# Patient Record
Sex: Male | Born: 1970
Health system: Southern US, Community
[De-identification: ages and names within clinical notes are randomized; demographics above are authoritative.]

## PROBLEM LIST (undated history)

## (undated) DIAGNOSIS — I10 Essential (primary) hypertension: Secondary | ICD-10-CM

## (undated) DIAGNOSIS — K219 Gastro-esophageal reflux disease without esophagitis: Secondary | ICD-10-CM

## (undated) DIAGNOSIS — M109 Gout, unspecified: Secondary | ICD-10-CM

## (undated) DIAGNOSIS — Z87442 Personal history of urinary calculi: Secondary | ICD-10-CM

## (undated) DIAGNOSIS — E785 Hyperlipidemia, unspecified: Secondary | ICD-10-CM

## (undated) DIAGNOSIS — R12 Heartburn: Secondary | ICD-10-CM

## (undated) HISTORY — DX: Heartburn: R12

## (undated) HISTORY — DX: Personal history of urinary calculi: Z87.442

## (undated) HISTORY — DX: Hyperlipidemia, unspecified: E78.5

## (undated) HISTORY — DX: Essential (primary) hypertension: I10

## (undated) HISTORY — PX: CYSTECTOMY: SUR359

---
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
## 2013-09-08 ENCOUNTER — Ambulatory Visit (INDEPENDENT_AMBULATORY_CARE_PROVIDER_SITE_OTHER): Payer: PRIVATE HEALTH INSURANCE | Admitting: Family Medicine

## 2013-09-08 ENCOUNTER — Encounter: Payer: Self-pay | Admitting: *Deleted

## 2013-09-08 ENCOUNTER — Encounter: Payer: Self-pay | Admitting: Family Medicine

## 2013-09-08 VITALS — BP 150/96 | HR 80 | Temp 98.2°F | Ht 68.0 in | Wt 198.0 lb

## 2013-09-08 DIAGNOSIS — R12 Heartburn: Secondary | ICD-10-CM

## 2013-09-08 DIAGNOSIS — E785 Hyperlipidemia, unspecified: Secondary | ICD-10-CM | POA: Insufficient documentation

## 2013-09-08 DIAGNOSIS — Z87442 Personal history of urinary calculi: Secondary | ICD-10-CM | POA: Insufficient documentation

## 2013-09-08 DIAGNOSIS — Z23 Encounter for immunization: Secondary | ICD-10-CM

## 2013-09-08 DIAGNOSIS — I10 Essential (primary) hypertension: Secondary | ICD-10-CM | POA: Insufficient documentation

## 2013-09-08 MED ORDER — AMLODIPINE BESYLATE 5 MG PO TABS: 5.0000 mg | ORAL_TABLET | Freq: Every day | ORAL | Status: DC

## 2013-09-08 NOTE — Patient Instructions (Addendum)
Flu shot today. Return at your convenience fasting for blood work. Let's start amlodipine 5mg  daily for blood pressure - watch for ankle swelling. Baseline EKG today. Return in 1-2 months for follow up. Update me sooner if any concerns.

## 2013-09-08 NOTE — Assessment & Plan Note (Addendum)
Several elevated readings in the past as well as on recheck today.  Will start amlodipine 5mg  daily - discussed common side effect to monitor including ankle swelling. baseline EKG today and return fasting for blood work. RTC 1-2 mo for f/u Pt agrees with plan.  EKG NSR rate 75, normal axis and intervals, no acute ST/T changes.

## 2013-09-08 NOTE — Progress Notes (Signed)
Pre-visit discussion using our clinic review tool. No additional management support is needed unless otherwise documented below in the visit note.  

## 2013-09-08 NOTE — Progress Notes (Signed)
Subjective:    Patient ID: Shawn Price, male    DOB: 02/04/1969, 42 y.o.   MRN: [id number]  HPI CC: new pt to establish  Prior saw Dr. Dossie Arbour then Dr. Patrecia Pace.  Hasn't had good exp with MDs in past. Planning trip to [facility]  Elevated BP - h/o this.  Has had high blood pressure readings for years.  Never on meds.  No HA, vision changes, CP/tightness, SOB, leg swelling. Feels flushed when bp increases.  Would be interested in med for this.  Elevated cholesterol levels as well - prior treated with crestor but caused significant myalgias/weakness.  This was changed to niacin but caused significant flushing.  Heartburn - controlled with tums.  Noticing increasing discomfort with bilateral shoulders - backed off lifting and going to gym.  Wonders about RTC issue.  Stopped running because knees started bothering him.  Sees eye doctor regularly (wears contacts). Sees dentist every 3 yrs (Dr. Wilson Singer).  Remarried this year (2014) Lives with 2nd wife, 2 daughters and step son, 1 dog Occupation: Emergency planning/management officer Edu: bachelor degree Activity: runs a few times a week Diet: good water, some fruits/vegetables   Preventative No recent CPE Flu shot today.  Medications and allergies reviewed and updated in chart.  Past histories reviewed and updated if relevant as below. There are no active problems to display for this patient.  Past Medical History  Diagnosis Date  . Heartburn     controlled with tums  . HLD (hyperlipidemia)   . HTN (hypertension)   . History of kidney stones 1990   Past Surgical History  Procedure Laterality Date  . Cystectomy Right ~1998    ganglion cyst on wrist   History  Substance Use Topics  . Smoking status: Never Smoker   . Smokeless tobacco: Current User    Types: Snuff     Comment: 1 can/day  . Alcohol Use: Yes     Comment: Regular-beer at night when at home   Family History  Problem Relation Age of Onset  . Hypertension Mother     father, multiple  relatives  . Stroke Paternal Grandmother 31  . Hyperlipidemia Mother     father, multiple relatives  . Cancer Maternal Grandfather 70    lung, smoker  . COPD Maternal Grandmother   . Heart failure Maternal Grandmother 90  . Diabetes Neg Hx    Allergies  Allergen Reactions  . Crestor [Rosuvastatin] Other (See Comments)    myalgias   No current outpatient prescriptions on file prior to visit.   No current facility-administered medications on file prior to visit.     Review of Systems Per HPI    Objective:   Physical Exam  Nursing note and vitals reviewed. Constitutional: He is oriented to person, place, and time. He appears well-developed and well-nourished. No distress.  HENT:  Head: Normocephalic and atraumatic.  Right Ear: Hearing, tympanic membrane, external ear and ear canal normal.  Left Ear: Hearing, tympanic membrane, external ear and ear canal normal.  Nose: Nose normal.  Mouth/Throat: Oropharynx is clear and moist. No oropharyngeal exudate.  Eyes: Conjunctivae and EOM are normal. Pupils are equal, round, and reactive to light. No scleral icterus.  Neck: Normal range of motion. Neck supple. No thyromegaly present.  Cardiovascular: Normal rate, regular rhythm, normal heart sounds and intact distal pulses.   No murmur heard. Pulses:      Radial pulses are 2+ on the right side, and 2+ on the left side.  Pulmonary/Chest: Effort  normal and breath sounds normal. No respiratory distress. He has no wheezes. He has no rales.  Abdominal: Soft. Bowel sounds are normal. He exhibits no distension and no mass. There is no tenderness. There is no rebound and no guarding.  Musculoskeletal: Normal range of motion. He exhibits no edema.  Lymphadenopathy:    He has no cervical adenopathy.  Neurological: He is alert and oriented to person, place, and time.  CN grossly intact, station and gait intact  Skin: Skin is warm and dry. No rash noted.  Psychiatric: He has a normal mood and  affect. His behavior is normal. Judgment and thought content normal.       Assessment & Plan:

## 2013-09-08 NOTE — Assessment & Plan Note (Addendum)
H/o this.  Check FLP when he returns fasting. H/o severe myalgias with crestor and flushing with niacin.

## 2013-09-08 NOTE — Addendum Note (Signed)
Addended by: Josph Macho A on: 09/08/2013 10:43 AM   Modules accepted: Orders

## 2013-09-08 NOTE — Addendum Note (Signed)
Addended by: Josph Macho A on: 09/08/2013 10:27 AM   Modules accepted: Orders

## 2013-10-20 ENCOUNTER — Ambulatory Visit: Payer: Self-pay | Admitting: Family Medicine

## 2013-12-09 ENCOUNTER — Other Ambulatory Visit (INDEPENDENT_AMBULATORY_CARE_PROVIDER_SITE_OTHER): Payer: PRIVATE HEALTH INSURANCE

## 2013-12-09 ENCOUNTER — Encounter: Payer: Self-pay | Admitting: Radiology

## 2013-12-09 ENCOUNTER — Encounter: Payer: Self-pay | Admitting: Family Medicine

## 2013-12-09 DIAGNOSIS — I10 Essential (primary) hypertension: Secondary | ICD-10-CM

## 2013-12-09 DIAGNOSIS — E785 Hyperlipidemia, unspecified: Secondary | ICD-10-CM

## 2013-12-09 LAB — COMPREHENSIVE METABOLIC PANEL
ALT: 40 U/L (ref 0–53)
AST: 21 U/L (ref 0–37)
Albumin: 4.7 g/dL (ref 3.5–5.2)
Alkaline Phosphatase: 62 U/L (ref 39–117)
BUN: 16 mg/dL (ref 6–23)
CO2: 27 mEq/L (ref 19–32)
Calcium: 9.9 mg/dL (ref 8.4–10.5)
Chloride: 102 mEq/L (ref 96–112)
Creatinine, Ser: 1.1 mg/dL (ref 0.4–1.5)
GFR: 79.75 mL/min (ref >60.00)
Glucose, Bld: 105 mg/dL — ABNORMAL HIGH (ref 70–99)
Potassium: 4.4 mEq/L (ref 3.5–5.1)
Sodium: 137 mEq/L (ref 135–145)
Total Bilirubin: 0.7 mg/dL (ref 0.3–1.2)
Total Protein: 7.7 g/dL (ref 6.0–8.3)

## 2013-12-09 LAB — LIPID PANEL
Cholesterol: 263 mg/dL — ABNORMAL HIGH (ref 0–200)
HDL: 33.2 mg/dL — ABNORMAL LOW (ref >39.00)
LDL Cholesterol: 112 mg/dL — ABNORMAL HIGH (ref 0–99)
Total CHOL/HDL Ratio: 8
Triglycerides: 587 mg/dL — ABNORMAL HIGH (ref 0.0–149.0)
VLDL: 117.4 mg/dL — ABNORMAL HIGH (ref 0.0–40.0)

## 2013-12-09 LAB — TSH: TSH: 1.56 u[IU]/mL (ref 0.35–5.50)

## 2013-12-10 ENCOUNTER — Other Ambulatory Visit: Payer: Self-pay | Admitting: Family Medicine

## 2013-12-10 MED ORDER — FENOFIBRATE 145 MG PO TABS: 145.0000 mg | ORAL_TABLET | Freq: Every day | ORAL | Status: DC

## 2013-12-14 NOTE — Progress Notes (Signed)
Pt did not pick up fenofibrate due to cost of med (cost to pt was $50.00). Pt wants to know if there is a coupon for med or can a less expensive med be sent  [facility]. Pt request cb at [phone].

## 2013-12-15 MED ORDER — FENOFIBRATE MICRONIZED 134 MG PO CAPS: 134.0000 mg | ORAL_CAPSULE | Freq: Every day | ORAL | Status: DC

## 2013-12-15 NOTE — Addendum Note (Signed)
Addended by: Eustaquio BoydenGUTIERREZ, Izmael Duross on: 12/15/2013 08:37 AM   Modules accepted: Orders, Medications

## 2013-12-15 NOTE — Progress Notes (Signed)
Pt left v/m requesting cb at [phone].

## 2013-12-15 NOTE — Progress Notes (Signed)
Left message on voice mail  to call back

## 2013-12-15 NOTE — Progress Notes (Signed)
plz notify patient I've sent in lower dose hopeful for cheaper.  To call pharmacy to check on price and if affordable try this med.  O/w let me know

## 2013-12-17 NOTE — Progress Notes (Signed)
Pt advise new Rx sent in (lower dose) and to let us know if this Rx is to expensive

## 2014-01-14 ENCOUNTER — Other Ambulatory Visit: Payer: Self-pay | Admitting: Family Medicine

## 2014-01-15 ENCOUNTER — Other Ambulatory Visit: Payer: Self-pay | Admitting: Family Medicine

## 2014-03-13 ENCOUNTER — Other Ambulatory Visit: Payer: Self-pay | Admitting: Family Medicine

## 2014-03-21 ENCOUNTER — Other Ambulatory Visit: Payer: Self-pay | Admitting: Family Medicine

## 2014-07-31 ENCOUNTER — Other Ambulatory Visit: Payer: Self-pay | Admitting: Family Medicine

## 2014-09-03 ENCOUNTER — Other Ambulatory Visit: Payer: Self-pay | Admitting: Family Medicine

## 2014-12-18 ENCOUNTER — Other Ambulatory Visit: Payer: Self-pay | Admitting: Family Medicine

## 2015-03-04 ENCOUNTER — Other Ambulatory Visit: Payer: Self-pay | Admitting: Family Medicine

## 2015-03-04 ENCOUNTER — Other Ambulatory Visit (INDEPENDENT_AMBULATORY_CARE_PROVIDER_SITE_OTHER): Payer: No Typology Code available for payment source

## 2015-03-04 DIAGNOSIS — E785 Hyperlipidemia, unspecified: Secondary | ICD-10-CM

## 2015-03-04 DIAGNOSIS — I10 Essential (primary) hypertension: Secondary | ICD-10-CM

## 2015-03-04 LAB — LDL CHOLESTEROL, DIRECT: Direct LDL: 118 mg/dL

## 2015-03-04 LAB — LIPID PANEL
Cholesterol: 236 mg/dL — ABNORMAL HIGH (ref 0–200)
HDL: 31.4 mg/dL — ABNORMAL LOW (ref >39.00)
Total CHOL/HDL Ratio: 8
Triglycerides: 537 mg/dL — ABNORMAL HIGH (ref 0.0–149.0)

## 2015-03-04 LAB — COMPREHENSIVE METABOLIC PANEL
ALT: 24 U/L (ref 0–53)
AST: 14 U/L (ref 0–37)
Albumin: 4.6 g/dL (ref 3.5–5.2)
Alkaline Phosphatase: 65 U/L (ref 39–117)
BUN: 15 mg/dL (ref 6–23)
CO2: 28 mEq/L (ref 19–32)
Calcium: 9.8 mg/dL (ref 8.4–10.5)
Chloride: 102 mEq/L (ref 96–112)
Creatinine, Ser: 1.02 mg/dL (ref 0.40–1.50)
GFR: 84.69 mL/min (ref >60.00)
Glucose, Bld: 108 mg/dL — ABNORMAL HIGH (ref 70–99)
Potassium: 4.6 mEq/L (ref 3.5–5.1)
Sodium: 137 mEq/L (ref 135–145)
Total Bilirubin: 0.5 mg/dL (ref 0.2–1.2)
Total Protein: 7.4 g/dL (ref 6.0–8.3)

## 2015-03-10 ENCOUNTER — Encounter: Payer: Self-pay | Admitting: Family Medicine

## 2015-03-10 ENCOUNTER — Ambulatory Visit (INDEPENDENT_AMBULATORY_CARE_PROVIDER_SITE_OTHER): Payer: No Typology Code available for payment source | Admitting: Family Medicine

## 2015-03-10 ENCOUNTER — Encounter (INDEPENDENT_AMBULATORY_CARE_PROVIDER_SITE_OTHER): Payer: Self-pay

## 2015-03-10 VITALS — BP 130/88 | HR 97 | Temp 98.5°F | Ht 68.0 in | Wt 198.8 lb

## 2015-03-10 DIAGNOSIS — E785 Hyperlipidemia, unspecified: Secondary | ICD-10-CM | POA: Diagnosis not present

## 2015-03-10 DIAGNOSIS — Z0000 Encounter for general adult medical examination without abnormal findings: Secondary | ICD-10-CM | POA: Diagnosis not present

## 2015-03-10 DIAGNOSIS — R7303 Prediabetes: Secondary | ICD-10-CM | POA: Insufficient documentation

## 2015-03-10 DIAGNOSIS — E119 Type 2 diabetes mellitus without complications: Secondary | ICD-10-CM | POA: Insufficient documentation

## 2015-03-10 DIAGNOSIS — I10 Essential (primary) hypertension: Secondary | ICD-10-CM

## 2015-03-10 DIAGNOSIS — L819 Disorder of pigmentation, unspecified: Secondary | ICD-10-CM | POA: Insufficient documentation

## 2015-03-10 DIAGNOSIS — M25519 Pain in unspecified shoulder: Secondary | ICD-10-CM | POA: Diagnosis not present

## 2015-03-10 DIAGNOSIS — B360 Pityriasis versicolor: Secondary | ICD-10-CM

## 2015-03-10 DIAGNOSIS — R739 Hyperglycemia, unspecified: Secondary | ICD-10-CM

## 2015-03-10 DIAGNOSIS — E1169 Type 2 diabetes mellitus with other specified complication: Secondary | ICD-10-CM | POA: Insufficient documentation

## 2015-03-10 MED ORDER — AMLODIPINE BESYLATE 2.5 MG PO TABS: 2.5000 mg | ORAL_TABLET | Freq: Every day | ORAL | Status: DC

## 2015-03-10 MED ORDER — FENOFIBRATE MICRONIZED 134 MG PO CAPS: 134.0000 mg | ORAL_CAPSULE | Freq: Every day | ORAL | Status: DC

## 2015-03-10 NOTE — Progress Notes (Signed)
Pre visit review using our clinic review tool, if applicable. No additional management support is needed unless otherwise documented below in the visit note. 
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
## 2015-03-10 NOTE — Patient Instructions (Addendum)
Let's try lower blood pressure medicine dose (2.5mg  amlodipine) and monitor at home over next month (once a week). Let me know if consistently >140/90. Restart cholesterol medicine For tinea versicolor - try nizoral or selsun blue shampoo on skin once weekly. For shoulders - try exercises provided today. Let us know if worsening Return in 3-4 months for recheck cholesterol and sugar. Return in 1 year for next physical.

## 2015-03-10 NOTE — Assessment & Plan Note (Signed)
Preventative protocols reviewed and updated unless pt declined. Discussed healthy diet and lifestyle.  

## 2015-03-10 NOTE — Assessment & Plan Note (Signed)
Pt declines oral antifungal currently. Mainly cosmetic concern. Discussed nizoral or selsun blue shampoo on skin weekly for control. Consider oral antifungal if ineffective treatment.

## 2015-03-10 NOTE — Assessment & Plan Note (Signed)
Chronic, stable off meds for now but notices some elevated bp sxs. Will restart at lower dose and pt will monitor and notify me if persistently elevated.

## 2015-03-10 NOTE — Assessment & Plan Note (Signed)
Detailed discussion of diet changes to improve glycemic control.

## 2015-03-10 NOTE — Assessment & Plan Note (Signed)
Exam more consistent with supraspinatus tendinopathy or partial tear. Provided with exercises from [facility] pt advisor on RTC injury. Provided with resistance band.

## 2015-03-10 NOTE — Assessment & Plan Note (Signed)
Marked hypertriglyceridemia along with low HDL. Restart lofibra  daily.  Recheck FLP in 3-4 mo. Pt agrees with plan.

## 2015-03-27 ENCOUNTER — Encounter: Payer: Self-pay | Admitting: Physician Assistant

## 2015-03-27 ENCOUNTER — Ambulatory Visit: Payer: BLUE CROSS/BLUE SHIELD

## 2015-03-27 ENCOUNTER — Ambulatory Visit
Admission: EM | Admit: 2015-03-27 | Discharge: 2015-03-27 | Disposition: A | Discharge status: Home / Self Care | Payer: BLUE CROSS/BLUE SHIELD | Attending: Internal Medicine | Admitting: Internal Medicine

## 2015-03-27 DIAGNOSIS — I10 Essential (primary) hypertension: Secondary | ICD-10-CM | POA: Insufficient documentation

## 2015-03-27 DIAGNOSIS — E785 Hyperlipidemia, unspecified: Secondary | ICD-10-CM | POA: Diagnosis not present

## 2015-03-27 DIAGNOSIS — M10072 Idiopathic gout, left ankle and foot: Secondary | ICD-10-CM

## 2015-03-27 DIAGNOSIS — M79672 Pain in left foot: Secondary | ICD-10-CM | POA: Diagnosis present

## 2015-03-27 MED ORDER — KETOROLAC TROMETHAMINE 60 MG/2ML IM SOLN
60.0000 mg | Freq: Once | INTRAMUSCULAR | Status: AC
  Administered 2015-03-27: 60 mg via INTRAMUSCULAR

## 2015-03-27 MED ORDER — COLCHICINE 0.6 MG PO TABS: ORAL_TABLET | ORAL | Status: DC
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
## 2015-03-27 NOTE — Discharge Instructions (Signed)

## 2015-05-15 ENCOUNTER — Ambulatory Visit
Admission: EM | Admit: 2015-05-15 | Discharge: 2015-05-15 | Disposition: A | Discharge status: Home / Self Care | Payer: BLUE CROSS/BLUE SHIELD | Attending: Internal Medicine | Admitting: Internal Medicine

## 2015-05-15 DIAGNOSIS — M10472 Other secondary gout, left ankle and foot: Secondary | ICD-10-CM | POA: Diagnosis not present

## 2015-05-15 DIAGNOSIS — L02811 Cutaneous abscess of head [any part, except face]: Secondary | ICD-10-CM

## 2015-05-15 HISTORY — DX: Gout, unspecified: M10.9

## 2015-05-15 MED ORDER — SULFAMETHOXAZOLE-TRIMETHOPRIM 800-160 MG PO TABS: 1.0000 | ORAL_TABLET | Freq: Two times a day (BID) | ORAL | Status: AC

## 2015-05-15 MED ORDER — COLCHICINE 0.6 MG PO TABS: 0.6000 mg | ORAL_TABLET | Freq: Every day | ORAL | Status: DC

## 2015-05-15 NOTE — ED Notes (Addendum)
Hx first gout attack 03/27/15. C/o pain outer left foot "aching" started Thursday. This "feels the same". Also due to having short haircuts, has abscess posterior right scalp

## 2015-05-20 NOTE — ED Provider Notes (Signed)
CSN: [id number]     Arrival date & time 05/15/15  1123 History   First MD Initiated Contact with Patient 05/15/15 1331     Chief Complaint  Patient presents with  . 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 Had recent episode as well.  Discussed diet and particularly hydration in these hot summer month. Also with heat and sweating as well as short hair cut, He is having difficulty with abscesses of his scalp. Wears a hat or helmet almost always. Notes single larger raised tender area and multiple smaller concerns Denies hx of same. Has tried Selsun shampoo without effect Past Medical History  Diagnosis Date  . Heartburn     controlled with tums  . Dyslipidemia     triglycerides  . HTN (hypertension)   . History of kidney stones 1990  . Gout    Past Surgical History  Procedure Laterality Date  . Cystectomy Right ~1998    ganglion cyst on wrist   Family History  Problem Relation Age of Onset  . Hypertension Mother     father, multiple relatives  . Hyperlipidemia Mother     father, multiple relatives  . Stroke Paternal Grandmother 27  . Cancer Maternal Grandfather 70    lung, smoker  . COPD Maternal Grandmother   . Heart failure Maternal Grandmother 90  . Diabetes Neg Hx    Social History  Substance Use Topics  . Smoking status: Never Smoker   . Smokeless tobacco: Current User    Types: Snuff     Comment: 1 can/day  . Alcohol Use: Yes     Comment: Regular-beer at night when at home    Review of Systems Review of 10 systems negative for acute change except as referenced in HPI  Allergies  Crestor  Home Medications   Prior to Admission medications   Medication Sig Start Date End Date Taking? Authorizing Provider  amLODipine (NORVASC) 2.5 MG tablet Take 1 tablet (2.5 mg total) by mouth daily. 03/10/15  Yes Eustaquio Boyden, MD  fenofibrate micronized (LOFIBRA) 134 MG capsule Take 1 capsule (134 mg total) by mouth daily before breakfast. 03/10/15  Yes Eustaquio Boyden, MD  colchicine 0.6 MG tablet Take 1 tablet (0.6 mg total) by mouth daily. 2 tablets now-- repeat 1 in one hour; then 1-2 per day as needed for next 10 days 05/15/15   Rae Halsted, PA-C  sulfamethoxazole-trimethoprim (BACTRIM DS,SEPTRA DS) 800-160 MG per tablet Take 1 tablet by mouth 2 (two) times daily. 05/15/15 05/22/15  Rae Halsted, PA-C   Meds Ordered and Administered this Visit  Medications - No data to display  BP 136/96 mmHg  Pulse 88  Temp(Src) 98 F (36.7 C) (Oral)  Resp 16  Ht 5\' 8"  (1.727 m)  Wt 200 lb (90.719 kg)  BMI 30.42 kg/m2  SpO2 100% No data found.   Physical Exam    Constitutional -alert and oriented,well appearing,mild distress- left foot aches Head-atraumatic, normocephalic. Wearing baseball cap-  scalp with multiple raise abscesses under distribution of cap coverage. Hair is very short and areas of alopecia have developed as well Eyes- conjunctiva normal, EOMI ,conjugate gaze Nose- no congestion or rhinorrhea Mouth/throat- mucous membranes moist , Neck- supple without lymphadenopathy CV- regular rate, grossly normal heart sounds,  Resp-no distress, normal respiratory effort,clear to auscultation bilaterally GI- o distention GU- not examined MSK- left foot mid-foot lateral mildly tender  to touch and exam, very faintly warm- patient recognizes pattern similar to last  Gout. Denies trauma to area. No ecchymosis Remainder of extremities non tender, normal ROM, all extremities, ambulatory, self-care Neuro- normal speech and language, no gross focal neurological deficit appreciated, no gait instability, Skin-warm,dry ,intact; no rash noted Psych-mood and affect grossly normal; speech and behavior grossly normal  ED Course  Procedures (including critical care time)  Labs Review Labs Reviewed - No data to  display  Imaging Review No results found.    DIscussed concerns about increasing., perhaps permanent alopecia as side effect of infectious process scalp. Make a priority of Derm consultation- business card given for [facility] as I had it available - but Derm of choice indicated. Antibiotic Rx initiated. Shampoo daily-gentle scalp- no hair products Initiate colchicine  in hopes of averting/diminishing Gout attack. 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 Has been particularly active in community lately with the national issues of police stress.     MDM   1. Other secondary acute gout of left foot   2. Abscess, scalp   Diagnosis and treatment discussed. Initiate antibiotic coverage for scalp and referral to Dermatology ASAP  . Questions fielded, expectations and recommendations reviewed.  Patient expresses understanding. Will return to [facility] with questions, concern or exacerbation.    Discharge Medication List as of 05/15/2015  1:57 PM    START taking these medications   Details  sulfamethoxazole-trimethoprim (BACTRIM DS,SEPTRA DS) 800-160 MG per tablet Take 1 tablet by mouth 2 (two) times daily., Starting 05/15/2015, Until Sun 05/22/15, Normal      colchicine 0.6 mg 2 stat, 1 in one hour then 1-2 daily for 10 days  Rae Halsted, PA-C 05/20/15 4540

## 2015-09-12 ENCOUNTER — Ambulatory Visit
Admission: EM | Admit: 2015-09-12 | Discharge: 2015-09-12 | Disposition: A | Discharge status: Home / Self Care | Payer: BLUE CROSS/BLUE SHIELD | Attending: Family Medicine | Admitting: Family Medicine

## 2015-09-12 DIAGNOSIS — M1A4720 Other secondary chronic gout, left ankle and foot, without tophus (tophi): Secondary | ICD-10-CM

## 2015-09-12 DIAGNOSIS — M109 Gout, unspecified: Secondary | ICD-10-CM

## 2015-09-12 MED ORDER — NAPROXEN 500 MG PO TABS: 500.0000 mg | ORAL_TABLET | Freq: Two times a day (BID) | ORAL | Status: DC

## 2015-09-12 MED ORDER — COLCHICINE 0.6 MG PO TABS: ORAL_TABLET | ORAL | Status: DC

## 2015-09-12 MED ORDER — KETOROLAC TROMETHAMINE 60 MG/2ML IM SOLN
60.0000 mg | Freq: Once | INTRAMUSCULAR | Status: AC
  Administered 2015-09-12: 60 mg via INTRAMUSCULAR

## 2015-09-12 NOTE — ED Provider Notes (Signed)
CSN: [id number]     Arrival date & time 09/12/15  4098 History   First MD Initiated Contact with Patient 09/12/15 [phone]     Chief Complaint  Patient presents with  . 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 It usually occurs on the as of the fifth metatarsal. He states that this started about 1 AM on New Year's Eve night is progressively worsened. Last night he was miserable at work.. Now is having difficulty walking and has a red swollen warm joint at the base of the fifth metatarsal. He used colchicine at this last attack which worked well and would like to resume that. He was going to see his primary but they are closed today due to new years.  Past Medical History  Diagnosis Date  . Heartburn     controlled with tums  . Dyslipidemia     triglycerides  . HTN (hypertension)   . History of kidney stones 1990  . Gout    Past Surgical History  Procedure Laterality Date  . Cystectomy Right ~1998    ganglion cyst on wrist   Family History  Problem Relation Age of Onset  . Hypertension Mother     father, multiple relatives  . Hyperlipidemia Mother     father, multiple relatives  . Stroke Paternal Grandmother 62  . Cancer Maternal Grandfather 70    lung, smoker  . COPD Maternal Grandmother   . Heart failure Maternal Grandmother 90  . Diabetes Neg Hx    Social History  Substance Use Topics  . Smoking status: Never Smoker   . Smokeless tobacco: Current User    Types: Snuff     Comment: 1 can/day  . Alcohol Use: 0.0 oz/week    0 Standard drinks or equivalent per week     Comment: Regular-beer at night when at home    Review of Systems  Constitutional: Positive for activity change.  Musculoskeletal: Positive for joint swelling and arthralgias.  Skin: Positive for color change.  All other systems reviewed and are  negative.   Allergies  Crestor  Home Medications   Prior to Admission medications   Medication Sig Start Date End Date Taking? Authorizing Provider  amLODipine (NORVASC) 2.5 MG tablet Take 1 tablet (2.5 mg total) by mouth daily. 03/10/15  Yes Eustaquio Boyden, MD  fenofibrate micronized (LOFIBRA) 134 MG capsule Take 1 capsule (134 mg total) by mouth daily before breakfast. 03/10/15  Yes Eustaquio Boyden, MD  colchicine (COLCRYS) 0.6 MG tablet Take 2 tabs (1.2 MG ) now . Repeat with 0.6 mg 1 hour later. May take 0.6 mg in 72 hours if necessary 09/12/15   Lutricia Feil, PA-C  naproxen (NAPROSYN) 500 MG tablet Take 1 tablet (500 mg total) by mouth 2 (two) times daily with a meal. 09/12/15   Lutricia Feil, PA-C   Meds Ordered and Administered this Visit   Medications  ketorolac (TORADOL) injection 60 mg (60 mg Intramuscular Given 09/12/15 0916)    BP 134/87 mmHg  Pulse 88  Temp(Src) 97 F (36.1 C) (Tympanic)  Resp 16  Ht 5\' 8"  (1.727 m)  Wt 200 lb (90.719 kg)  BMI 30.42 kg/m2  SpO2 100% No data found.   Physical Exam  Constitutional: He is oriented to person, place, and time. He appears well-developed and well-nourished. No distress.  HENT:  Head: Normocephalic and atraumatic.  Eyes: Conjunctivae are normal. Pupils are equal, round, and reactive to light.  Neck: Normal range of motion. Neck supple.  Musculoskeletal: Normal range of motion. He exhibits edema and tenderness.  Examination of his gait shows a definite antalgic gait on the left. As of the fifth metatarsal shows no swelling or warmth and erythema. He is very tender to the touch even attempting to touch each has a tendency to withdraw the foot. Crepitus or induration is noticed.  Neurological: He is alert and oriented to person, place, and time.  Skin: Skin is warm and dry. He is not diaphoretic.  Psychiatric: He has a normal mood and affect. His behavior is normal. Judgment and thought content normal.  Nursing note  and vitals reviewed.   ED Course  Procedures (including critical care time)  Labs Review Labs Reviewed - No data to display  Imaging Review No results found.   Visual Acuity Review  Right Eye Distance:   Left Eye Distance:   Bilateral Distance:    Right Eye Near:   Left Eye Near:    Bilateral Near:     09:16 Medication Given HM  ketorolac (TORADOL) injection 60 mg - Dose: 60 mg ; Route: Intramuscular ; Site: Right Ventrogluteal ; Scheduled Time: 0915         MDM   1. Other secondary chronic gout of left foot without tophus    Discharge Medication List as of 09/12/2015  9:16 AM    Plan: 1. Test/x-ray results and diagnosis reviewed with patient 2. rx as per orders; risks, benefits, potential side effects reviewed with patient 3. Recommend supportive treatment with dietary changes as necessary formation on low purine. Diet was provided to the patient. Recommended that he follow-up with his primary care physician for consideration of placement on allopurinol to prevent future attacks. Also provide him a prescription of Naprosyn that he may try when he first begins to feel the attack coming on to try and quell it prior to have become exacerbated.  4. F/u prn if symptoms worsen or don't improve     Lutricia FeilWilliam P Kallista Pae, PA-C 09/12/15 [id number]  Lutricia FeilWilliam P Millena Callins, PA-C 09/12/15 1040

## 2015-09-12 NOTE — ED Notes (Signed)
Patient states that this is his third attack of gout in the last 6 months. He states that pain started Sunday morning around 0100. Pain occurs in outer edge of left foot and states that pain is almost unbearable now.

## 2015-09-12 NOTE — Discharge Instructions (Signed)

## 2015-10-21 ENCOUNTER — Encounter: Payer: Self-pay | Admitting: Family Medicine

## 2015-10-21 ENCOUNTER — Ambulatory Visit (INDEPENDENT_AMBULATORY_CARE_PROVIDER_SITE_OTHER): Payer: BLUE CROSS/BLUE SHIELD | Admitting: Family Medicine

## 2015-10-21 VITALS — BP 110/70 | HR 80 | Temp 98.1°F | Ht 68.0 in | Wt 210.5 lb

## 2015-10-21 DIAGNOSIS — M10072 Idiopathic gout, left ankle and foot: Secondary | ICD-10-CM | POA: Diagnosis not present

## 2015-10-21 DIAGNOSIS — Z23 Encounter for immunization: Secondary | ICD-10-CM

## 2015-10-21 DIAGNOSIS — M1A9XX0 Chronic gout, unspecified, without tophus (tophi): Secondary | ICD-10-CM | POA: Insufficient documentation

## 2015-10-21 LAB — URIC ACID: Uric Acid, Serum: 6.8 mg/dL (ref 4.0–7.8)

## 2015-10-21 MED ORDER — ALLOPURINOL 100 MG PO TABS: 100.0000 mg | ORAL_TABLET | Freq: Every day | ORAL | Status: DC

## 2015-10-21 MED ORDER — COLCHICINE 0.6 MG PO TABS: ORAL_TABLET | ORAL | Status: DC

## 2015-10-21 NOTE — Progress Notes (Signed)
Pre visit review using our clinic review tool, if applicable. No additional management support is needed unless otherwise documented below in the visit note. 

## 2015-10-21 NOTE — Assessment & Plan Note (Signed)
No known triggers specifically, but many lifestyle cahnge to reduce uric acid in process.  Will check uric acid, plan starting allopurinol once this flare complete.  Continue colcrys daily until flare resolved.   Follow up in 4-6 weeks for re-eval uric acid.

## 2015-10-21 NOTE — Progress Notes (Signed)
   Subjective:    Patient ID: Shawn Price, male    DOB: October 17, 1970, 45 y.o.   MRN: [id number]  HPI  45 year old male with history of HTN and gout presents with possible gout flare. Severe pain in left foot laterally, gradually worse in last 2 days redness, warmth.  No known injury.  yesterday took 2 tabs of colcrys, then second dose... Today pain is much better.   He was dx with gout emperically last year in 03/2015 with Urgent visit.  X-ray negative.  Does not appear uric acid was checked.  Started on colcrys, ice and elevation.  Since then he has had 3 flares since. 05/2015, 09/2015  He has decrease ETOH intake, has tried to decreased shelfish, working on weight loss.   Family history of gout.  Review of Systems  Constitutional: Negative for fatigue.  HENT: Negative for ear pain.   Eyes: Negative for pain.  Respiratory: Negative for cough.   Cardiovascular: Negative for chest pain.       Objective:   Physical Exam  Constitutional: Vital signs are normal. He appears well-developed and well-nourished.  HENT:  Head: Normocephalic.  Right Ear: Hearing normal.  Left Ear: Hearing normal.  Nose: Nose normal.  Mouth/Throat: Oropharynx is clear and moist and mucous membranes are normal.  Neck: Trachea normal. Carotid bruit is not present. No thyroid mass and no thyromegaly present.  Cardiovascular: Normal rate, regular rhythm and normal pulses.  Exam reveals no gallop, no distant heart sounds and no friction rub.   No murmur heard. No peripheral edema  Pulmonary/Chest: Effort normal and breath sounds normal. No respiratory distress.  Skin: Skin is warm, dry and intact. No rash noted.  Erythema and warmth, swelling in lateral foot, tender to palpation, rest of foot exam wnl.  Psychiatric: He has a normal mood and affect. His speech is normal and behavior is normal. Thought content normal.          Assessment & Plan:

## 2015-10-21 NOTE — Patient Instructions (Signed)
Will check uric acid, plan starting allopurinol once this flare complete.  Continue colcrys daily until flare resolved.   Follow up in 4-6 weeks for re-eval uric acid.

## 2015-10-24 ENCOUNTER — Ambulatory Visit: Payer: BLUE CROSS/BLUE SHIELD | Admitting: Family Medicine

## 2015-11-18 ENCOUNTER — Other Ambulatory Visit (INDEPENDENT_AMBULATORY_CARE_PROVIDER_SITE_OTHER): Payer: BLUE CROSS/BLUE SHIELD

## 2015-11-18 ENCOUNTER — Other Ambulatory Visit: Payer: Self-pay | Admitting: Family Medicine

## 2015-11-18 DIAGNOSIS — M10072 Idiopathic gout, left ankle and foot: Secondary | ICD-10-CM

## 2015-11-18 DIAGNOSIS — E785 Hyperlipidemia, unspecified: Secondary | ICD-10-CM | POA: Diagnosis not present

## 2015-11-18 DIAGNOSIS — I10 Essential (primary) hypertension: Secondary | ICD-10-CM | POA: Diagnosis not present

## 2015-11-18 DIAGNOSIS — R739 Hyperglycemia, unspecified: Secondary | ICD-10-CM | POA: Diagnosis not present

## 2015-11-18 LAB — HEMOGLOBIN A1C: Hgb A1c MFr Bld: 5.4 % (ref 4.6–6.5)

## 2015-11-18 LAB — LIPID PANEL
Cholesterol: 239 mg/dL — ABNORMAL HIGH (ref 0–200)
HDL: 37.8 mg/dL — ABNORMAL LOW (ref >39.00)
NonHDL: 201.55
Total CHOL/HDL Ratio: 6
Triglycerides: 324 mg/dL — ABNORMAL HIGH (ref 0.0–149.0)
VLDL: 64.8 mg/dL — ABNORMAL HIGH (ref 0.0–40.0)

## 2015-11-18 LAB — URIC ACID: Uric Acid, Serum: 6.2 mg/dL (ref 4.0–7.8)

## 2015-11-18 LAB — BASIC METABOLIC PANEL
BUN: 16 mg/dL (ref 6–23)
CO2: 27 mEq/L (ref 19–32)
Calcium: 9.3 mg/dL (ref 8.4–10.5)
Chloride: 106 mEq/L (ref 96–112)
Creatinine, Ser: 1.13 mg/dL (ref 0.40–1.50)
GFR: 75 mL/min (ref >60.00)
Glucose, Bld: 113 mg/dL — ABNORMAL HIGH (ref 70–99)
Potassium: 4.4 mEq/L (ref 3.5–5.1)
Sodium: 139 mEq/L (ref 135–145)

## 2015-11-18 LAB — LDL CHOLESTEROL, DIRECT: Direct LDL: 145 mg/dL

## 2015-11-18 NOTE — Addendum Note (Signed)
Addended by: Alvina ChouWALSH, TERRI J on: 11/18/2015 08:45 AM   Modules accepted: Orders

## 2015-11-18 NOTE — Addendum Note (Signed)
Addended by: Baldomero LamyHAVERS, NATASHA C on: 11/18/2015 09:13 AM   Modules accepted: Orders

## 2015-11-19 ENCOUNTER — Encounter: Payer: Self-pay | Admitting: Family Medicine

## 2015-11-21 ENCOUNTER — Encounter: Payer: Self-pay | Admitting: *Deleted

## 2016-03-21 ENCOUNTER — Other Ambulatory Visit: Payer: Self-pay | Admitting: Family Medicine

## 2016-05-21 ENCOUNTER — Ambulatory Visit (INDEPENDENT_AMBULATORY_CARE_PROVIDER_SITE_OTHER): Payer: BLUE CROSS/BLUE SHIELD | Admitting: Family Medicine

## 2016-05-21 ENCOUNTER — Encounter: Payer: Self-pay | Admitting: Family Medicine

## 2016-05-21 ENCOUNTER — Other Ambulatory Visit: Payer: Self-pay | Admitting: Family Medicine

## 2016-05-21 VITALS — BP 138/88 | HR 88 | Temp 98.1°F | Ht 68.0 in | Wt 210.8 lb

## 2016-05-21 DIAGNOSIS — Z0000 Encounter for general adult medical examination without abnormal findings: Secondary | ICD-10-CM

## 2016-05-21 DIAGNOSIS — I10 Essential (primary) hypertension: Secondary | ICD-10-CM

## 2016-05-21 DIAGNOSIS — M10072 Idiopathic gout, left ankle and foot: Secondary | ICD-10-CM | POA: Diagnosis not present

## 2016-05-21 DIAGNOSIS — Z23 Encounter for immunization: Secondary | ICD-10-CM

## 2016-05-21 DIAGNOSIS — E785 Hyperlipidemia, unspecified: Secondary | ICD-10-CM

## 2016-05-21 DIAGNOSIS — E669 Obesity, unspecified: Secondary | ICD-10-CM | POA: Insufficient documentation

## 2016-05-21 DIAGNOSIS — R739 Hyperglycemia, unspecified: Secondary | ICD-10-CM

## 2016-05-21 LAB — COMPREHENSIVE METABOLIC PANEL
ALT: 35 U/L (ref 0–53)
AST: 17 U/L (ref 0–37)
Albumin: 4.9 g/dL (ref 3.5–5.2)
Alkaline Phosphatase: 52 U/L (ref 39–117)
BUN: 15 mg/dL (ref 6–23)
CO2: 28 mEq/L (ref 19–32)
Calcium: 10 mg/dL (ref 8.4–10.5)
Chloride: 102 mEq/L (ref 96–112)
Creatinine, Ser: 1.09 mg/dL (ref 0.40–1.50)
GFR: 78.01 mL/min (ref >60.00)
Glucose, Bld: 107 mg/dL — ABNORMAL HIGH (ref 70–99)
Potassium: 4.5 mEq/L (ref 3.5–5.1)
Sodium: 137 mEq/L (ref 135–145)
Total Bilirubin: 0.4 mg/dL (ref 0.2–1.2)
Total Protein: 7.8 g/dL (ref 6.0–8.3)

## 2016-05-21 LAB — LIPID PANEL
Cholesterol: 253 mg/dL — ABNORMAL HIGH (ref 0–200)
HDL: 38.4 mg/dL — ABNORMAL LOW (ref >39.00)
NonHDL: 214.38
Total CHOL/HDL Ratio: 7
Triglycerides: 385 mg/dL — ABNORMAL HIGH (ref 0.0–149.0)
VLDL: 77 mg/dL — ABNORMAL HIGH (ref 0.0–40.0)

## 2016-05-21 LAB — URIC ACID: Uric Acid, Serum: 6.9 mg/dL (ref 4.0–7.8)

## 2016-05-21 LAB — LDL CHOLESTEROL, DIRECT: Direct LDL: 154 mg/dL

## 2016-05-21 NOTE — Assessment & Plan Note (Signed)
Update labs today 

## 2016-05-21 NOTE — Assessment & Plan Note (Signed)
Discussed healthy diet and lifestyle changes to affect sustainable weight loss  

## 2016-05-21 NOTE — Assessment & Plan Note (Signed)
Preventative protocols reviewed and updated unless pt declined. Discussed healthy diet and lifestyle.  

## 2016-05-21 NOTE — Assessment & Plan Note (Deleted)
Update A1c ?

## 2016-05-21 NOTE — Patient Instructions (Addendum)
Flu shot today Labs today.   Health Maintenance, Male A healthy lifestyle and preventative care can promote health and wellness.  Maintain regular health, dental, and eye exams.  Eat a healthy diet. Foods like vegetables, fruits, whole grains, low-fat dairy products, and lean protein foods contain the nutrients you need and are low in calories. Decrease your intake of foods high in solid fats, added sugars, and salt. Get information about a proper diet from your health care provider, if necessary.  Regular physical exercise is one of the most important things you can do for your health. Most adults should get at least 150 minutes of moderate-intensity exercise (any activity that increases your heart rate and causes you to sweat) each week. In addition, most adults need muscle-strengthening exercises on 2 or more days a week.   Maintain a healthy weight. The body mass index (BMI) is a screening tool to identify possible weight problems. It provides an estimate of body fat based on height and weight. Your health care provider can find your BMI and can help you achieve or maintain a healthy weight. For males 20 years and older:  A BMI below 18.5 is considered underweight.  A BMI of 18.5 to 24.9 is normal.  A BMI of 25 to 29.9 is considered overweight.  A BMI of 30 and above is considered obese.  Maintain normal blood lipids and cholesterol by exercising and minimizing your intake of saturated fat. Eat a balanced diet with plenty of fruits and vegetables. Blood tests for lipids and cholesterol should begin at age 44 and be repeated every 5 years. If your lipid or cholesterol levels are high, you are over age 10350, or you are at high risk for heart disease, you may need your cholesterol levels checked more frequently.Ongoing high lipid and cholesterol levels should be treated with medicines if diet and exercise are not working.  If you smoke, find out from your health care provider how to quit. If  you do not use tobacco, do not start.  Lung cancer screening is recommended for adults aged 55-80 years who are at high risk for developing lung cancer because of a history of smoking. A yearly low-dose CT scan of the lungs is recommended for people who have at least a 30-pack-year history of smoking and are current smokers or have quit within the past 15 years. A pack year of smoking is smoking an average of 1 pack of cigarettes a day for 1 year (for example, a 30-pack-year history of smoking could mean smoking 1 pack a day for 30 years or 2 packs a day for 15 years). Yearly screening should continue until the smoker has stopped smoking for at least 15 years. Yearly screening should be stopped for people who develop a health problem that would prevent them from having lung cancer treatment.  If you choose to drink alcohol, do not have more than 2 drinks per day. One drink is considered to be 12 oz (360 mL) of beer, 5 oz (150 mL) of wine, or 1.5 oz (45 mL) of liquor.  Avoid the use of street drugs. Do not share needles with anyone. Ask for help if you need support or instructions about stopping the use of drugs.  High blood pressure causes heart disease and increases the risk of stroke. High blood pressure is more likely to develop in:  People who have blood pressure in the end of the normal range (100-139/85-89 mm Hg).  People who are overweight or obese.  People who are African American.  If you are 56-48 years of age, have your blood pressure checked every 3-5 years. If you are 60 years of age or older, have your blood pressure checked every year. You should have your blood pressure measured twice--once when you are at a hospital or clinic, and once when you are not at a hospital or clinic. Record the average of the two measurements. To check your blood pressure when you are not at a hospital or clinic, you can use:  An automated blood pressure machine at a pharmacy.  A home blood pressure  monitor.  If you are 25-31 years old, ask your health care provider if you should take aspirin to prevent heart disease.  Diabetes screening involves taking a blood sample to check your fasting blood sugar level. This should be done once every 3 years after age 87 if you are at a normal weight and without risk factors for diabetes. Testing should be considered at a younger age or be carried out more frequently if you are overweight and have at least 1 risk factor for diabetes.  Colorectal cancer can be detected and often prevented. Most routine colorectal cancer screening begins at the age of 40 and continues through age 55. However, your health care provider may recommend screening at an earlier age if you have risk factors for colon cancer. On a yearly basis, your health care provider may provide home test kits to check for hidden blood in the stool. A small camera at the end of a tube may be used to directly examine the colon (sigmoidoscopy or colonoscopy) to detect the earliest forms of colorectal cancer. Talk to your health care provider about this at age 60 when routine screening begins. A direct exam of the colon should be repeated every 5-10 years through age 37, unless early forms of precancerous polyps or small growths are found.  People who are at an increased risk for hepatitis B should be screened for this virus. You are considered at high risk for hepatitis B if:  You were born in a country where hepatitis B occurs often. Talk with your health care provider about which countries are considered high risk.  Your parents were born in a high-risk country and you have not received a shot to protect against hepatitis B (hepatitis B vaccine).  You have HIV or AIDS.  You use needles to inject street drugs.  You live with, or have sex with, someone who has hepatitis B.  You are a man who has sex with other men (MSM).  You get hemodialysis treatment.  You take certain medicines for  conditions like cancer, organ transplantation, and autoimmune conditions.  Hepatitis C blood testing is recommended for all people born from [birth year] through 1964 and any individual with known risk factors for hepatitis C.  Healthy men should no longer receive prostate-specific antigen (PSA) blood tests as part of routine cancer screening. Talk to your health care provider about prostate cancer screening.  Testicular cancer screening is not recommended for adolescents or adult males who have no symptoms. Screening includes self-exam, a health care provider exam, and other screening tests. Consult with your health care provider about any symptoms you have or any concerns you have about testicular cancer.  Practice safe sex. Use condoms and avoid high-risk sexual practices to reduce the spread of sexually transmitted infections (STIs).  You should be screened for STIs, including gonorrhea and chlamydia if:  You are sexually active and are  younger than 24 years.  You are older than 24 years, and your health care provider tells you that you are at risk for this type of infection.  Your sexual activity has changed since you were last screened, and you are at an increased risk for chlamydia or gonorrhea. Ask your health care provider if you are at risk.  If you are at risk of being infected with HIV, it is recommended that you take a prescription medicine daily to prevent HIV infection. This is called pre-exposure prophylaxis (PrEP). You are considered at risk if:  You are a man who has sex with other men (MSM).  You are a heterosexual man who is sexually active with multiple partners.  You take drugs by injection.  You are sexually active with a partner who has HIV.  Talk with your health care provider about whether you are at high risk of being infected with HIV. If you choose to begin PrEP, you should first be tested for HIV. You should then be tested every 3 months for as long as you are taking  PrEP.  Use sunscreen. Apply sunscreen liberally and repeatedly throughout the day. You should seek shade when your shadow is shorter than you. Protect yourself by wearing long sleeves, pants, a wide-brimmed hat, and sunglasses year round whenever you are outdoors.  Tell your health care provider of new moles or changes in moles, especially if there is a change in shape or color. Also, tell your health care provider if a mole is larger than the size of a pencil eraser.  A one-time screening for abdominal aortic aneurysm (AAA) and surgical repair of large AAAs by ultrasound is recommended for men aged 54-75 years who are current or former smokers.  Stay current with your vaccines (immunizations).   This information is not intended to replace advice given to you by your health care provider. Make sure you discuss any questions you have with your health care provider.   Document Released: 02/23/2008 Document Revised: 09/17/2014 Document Reviewed: 01/22/2011 Elsevier Interactive Patient Education [employer].

## 2016-05-21 NOTE — Progress Notes (Signed)
Pre visit review using our clinic review tool, if applicable. No additional management support is needed unless otherwise documented below in the visit note. 

## 2016-05-21 NOTE — Assessment & Plan Note (Signed)
No further flares since allopurinol started. Check urate today.

## 2016-05-21 NOTE — Assessment & Plan Note (Signed)
Chronic, stable. Continue current regimen. 

## 2016-05-21 NOTE — Progress Notes (Signed)
BP 138/88 (BP Location: Right Arm, Cuff Size: Large)   Pulse 88   Temp 98.1 F (36.7 C) (Oral)   Ht 5\' 8"  (1.727 m)   Wt 210 lb 12 oz (95.6 kg)   BMI 32.04 kg/m    CC: CPE Subjective:    Patient ID: Shawn Price, male    DOB: 1970-10-25, 45 y.o.   MRN: [id number]  HPI: Shawn Price is a 45 y.o. male presenting on 05/21/2016 for Annual Exam   Marked hypertriglyceridemia - on lofibra.  HTN - amlodipine 2.5mg  daily was started.  Gout - started on allopurinol 100mg  daily earlier this year for gout flare. Doing better.  Planning on changing to low carb diet. Motivated to lose weight for upcoming PoPat 11/2016.   Preventative: Flu shot today. Seat belt use discussed Sunscreen use discussed. No changing moles. Non smoker Alcohol - cutting down on beers due to gout.   Remarried this year (2014)  Lives with 2nd wife, 2 daughters and step son, 1 dog  Occupation: Emergency planning/management officer  Edu: bachelor degree  Activity: runs a few times a week  Diet: good water, some fruits/vegetables, wants to start low carb diet   Relevant past medical, surgical, family and social history reviewed and updated as indicated. Interim medical history since our last visit reviewed. Allergies and medications reviewed and updated. Current Outpatient Prescriptions on File Prior to Visit  Medication Sig  . allopurinol (ZYLOPRIM) 100 MG tablet Take 1 tablet (100 mg total) by mouth daily.  Marland Kitchen amLODipine (NORVASC) 2.5 MG tablet TAKE 1 TABLET(2.5 MG) BY MOUTH DAILY  . colchicine (COLCRYS) 0.6 MG tablet Take 2 tabs (1.2 MG ) at beginning of flare. Repeat with 0.6 mg 1 hour later.  Continue 1 tab daily until flare complete.  . fenofibrate micronized (LOFIBRA) 134 MG capsule TAKE 1 CAPSULE(134 MG) BY MOUTH DAILY BEFORE BREAKFAST  . Ciclopirox 1 % shampoo    No current facility-administered medications on file prior to visit.     Review of Systems  Constitutional: Negative for activity change, appetite change, chills,  fatigue, fever and unexpected weight change.  HENT: Negative for hearing loss.   Eyes: Negative for visual disturbance.  Respiratory: Negative for cough, chest tightness, shortness of breath and wheezing.   Cardiovascular: Negative for chest pain, palpitations and leg swelling.  Gastrointestinal: Negative for abdominal distention, abdominal pain, blood in stool, constipation, diarrhea, nausea and vomiting.  Genitourinary: Negative for difficulty urinating and hematuria.  Musculoskeletal: Negative for arthralgias, myalgias and neck pain.  Skin: Negative for rash.  Neurological: Negative for dizziness, seizures, syncope and headaches (mild dull).  Hematological: Negative for adenopathy. Does not bruise/bleed easily.  Psychiatric/Behavioral: Negative for dysphoric mood. The patient is not nervous/anxious.    Per HPI unless specifically indicated in ROS section     Objective:    BP 138/88 (BP Location: Right Arm, Cuff Size: Large)   Pulse 88   Temp 98.1 F (36.7 C) (Oral)   Ht 5\' 8"  (1.727 m)   Wt 210 lb 12 oz (95.6 kg)   BMI 32.04 kg/m   Wt Readings from Last 3 Encounters:  05/21/16 210 lb 12 oz (95.6 kg)  10/21/15 210 lb 8 oz (95.5 kg)  09/12/15 200 lb (90.7 kg)    Physical Exam  Constitutional: He is oriented to person, place, and time. He appears well-developed and well-nourished. No distress.  HENT:  Head: Normocephalic and atraumatic.  Right Ear: Hearing, tympanic membrane, external ear and ear  canal normal.  Left Ear: Hearing, tympanic membrane, external ear and ear canal normal.  Nose: Nose normal.  Mouth/Throat: Uvula is midline, oropharynx is clear and moist and mucous membranes are normal. No oropharyngeal exudate, posterior oropharyngeal edema or posterior oropharyngeal erythema.  Eyes: Conjunctivae and EOM are normal. Pupils are equal, round, and reactive to light. No scleral icterus.  Neck: Normal range of motion. Neck supple. No thyromegaly present.    Cardiovascular: Normal rate, regular rhythm, normal heart sounds and intact distal pulses.   No murmur heard. Pulses:      Radial pulses are 2+ on the right side, and 2+ on the left side.  Pulmonary/Chest: Effort normal and breath sounds normal. No respiratory distress. He has no wheezes. He has no rales.  Abdominal: Soft. Bowel sounds are normal. He exhibits no distension and no mass. There is no tenderness. There is no rebound and no guarding.  Musculoskeletal: Normal range of motion. He exhibits no edema.  Lymphadenopathy:    He has no cervical adenopathy.  Neurological: He is alert and oriented to person, place, and time.  CN grossly intact, station and gait intact  Skin: Skin is warm and dry. No rash noted.  Psychiatric: He has a normal mood and affect. His behavior is normal. Judgment and thought content normal.  Nursing note and vitals reviewed.  Results for orders placed or performed in visit on 11/18/15  Uric acid  Result Value Ref Range   Uric Acid, Serum 6.2 4.0 - 7.8 mg/dL  Lipid panel  Result Value Ref Range   Cholesterol 239 (H) 0 - 200 mg/dL   Triglycerides 161.0 (H) 0.0 - 149.0 mg/dL   HDL 96.04 (L) >54.09 mg/dL   VLDL 81.1 (H) 0.0 - 91.4 mg/dL   Total CHOL/HDL Ratio 6    NonHDL 201.55   Hemoglobin A1c  Result Value Ref Range   Hgb A1c MFr Bld 5.4 4.6 - 6.5 %  Basic metabolic panel  Result Value Ref Range   Sodium 139 135 - 145 mEq/L   Potassium 4.4 3.5 - 5.1 mEq/L   Chloride 106 96 - 112 mEq/L   CO2 27 19 - 32 mEq/L   Glucose, Bld 113 (H) 70 - 99 mg/dL   BUN 16 6 - 23 mg/dL   Creatinine, Ser 7.82 0.40 - 1.50 mg/dL   Calcium 9.3 8.4 - 95.6 mg/dL   GFR 21.30 >86.57 mL/min  LDL cholesterol, direct  Result Value Ref Range   Direct LDL 145.0 mg/dL      Assessment & Plan:   Problem List Items Addressed This Visit    Acute idiopathic gout of foot    No further flares since allopurinol started. Check urate today.      Relevant Orders   Uric acid    Dyslipidemia    Update labs today.       Relevant Orders   Lipid panel   Comprehensive metabolic panel   Health maintenance examination - Primary    Preventative protocols reviewed and updated unless pt declined. Discussed healthy diet and lifestyle.       HTN (hypertension)    Chronic, stable. Continue current regimen.       Hyperglycemia   Obesity, Class I, BMI 30-34.9    Discussed healthy diet and lifestyle changes to affect sustainable weight loss.       Other Visit Diagnoses    Need for influenza vaccination       Relevant Orders   Flu Vaccine QUAD 36+  mos PF IM (Fluarix & Fluzone Quad PF) (Completed)       Follow up plan: Return in about 1 year (around 05/21/2017) for annual exam, prior fasting for blood work.  Eustaquio BoydenJavier Betzabeth Derringer, MD

## 2016-05-23 ENCOUNTER — Encounter: Payer: Self-pay | Admitting: *Deleted

## 2016-06-23 ENCOUNTER — Other Ambulatory Visit: Payer: Self-pay | Admitting: Family Medicine

## 2016-09-09 ENCOUNTER — Encounter: Payer: Self-pay | Admitting: Gynecology

## 2016-09-09 ENCOUNTER — Ambulatory Visit
Admission: EM | Admit: 2016-09-09 | Discharge: 2016-09-09 | Disposition: A | Discharge status: Home / Self Care | Payer: BLUE CROSS/BLUE SHIELD | Attending: Family Medicine | Admitting: Family Medicine

## 2016-09-09 DIAGNOSIS — M109 Gout, unspecified: Secondary | ICD-10-CM

## 2016-09-09 MED ORDER — INDOMETHACIN 25 MG PO CAPS: 25.0000 mg | ORAL_CAPSULE | Freq: Two times a day (BID) | ORAL | 0 refills | Status: AC

## 2016-09-09 MED ORDER — ALLOPURINOL 300 MG PO TABS: 300.0000 mg | ORAL_TABLET | Freq: Every day | ORAL | 1 refills | Status: DC

## 2016-09-09 MED ORDER — COLCHICINE 0.6 MG PO TABS: 0.6000 mg | ORAL_TABLET | Freq: Every day | ORAL | 0 refills | Status: DC

## 2016-09-09 NOTE — ED Provider Notes (Signed)
CSN: [id number]     Arrival date & time 09/09/16  [id number] History   First MD Initiated Contact with Patient 09/09/16 1152     Chief Complaint  Patient presents with  . Gout   (Consider location/radiation/quality/duration/timing/severity/associated sxs/prior Treatment) Patient is a well-appearing 45 y.o. Male, with history of gout, presents today for gout flare up. Patient is on Allopurinol 100 mg for gout prophylaxis. Patient report frequent gout flare up and states that the allopurinol is not working. Patient reports his last flare up prior to today, was earlier this month and the flare up resolved with colchicine.  Patient states that his gout flared up again this morning with pain at the left lateral foot. Patient states that his gout will always flare up at the left lateral foot. Patient reports current pain to be 8/10.       Past Medical History:  Diagnosis Date  . Dyslipidemia    predominantly hypertriglyceridemia  . Gout   . Heartburn    controlled with tums  . History of kidney stones 1990  . HTN (hypertension)    Past Surgical History:  Procedure Laterality Date  . CYSTECTOMY Right ~1998   ganglion cyst on wrist   Family History  Problem Relation Age of Onset  . Hypertension Mother     father, multiple relatives  . Hyperlipidemia Mother     father, multiple relatives  . Stroke Paternal Grandmother 1090  . Cancer Maternal Grandfather 70    lung, smoker  . COPD Maternal Grandmother   . Heart failure Maternal Grandmother 90  . Diabetes Neg Hx    Social History  Substance Use Topics  . Smoking status: Never Smoker  . Smokeless tobacco: Current User    Types: Snuff     Comment: 1 can/day  . Alcohol use 0.0 oz/week     Comment: Regular-beer at night when at home    Review of Systems  All other systems reviewed and are negative.   Allergies  Crestor [rosuvastatin]  Home Medications   Prior to Admission medications   Medication Sig Start Date End Date  Taking? Authorizing Provider  amLODipine (NORVASC) 2.5 MG tablet TAKE 1 TABLET(2.5 MG) BY MOUTH DAILY 06/25/16  Yes Eustaquio BoydenJavier Gutierrez, MD  Ciclopirox 1 % shampoo  09/29/15  Yes Historical Provider, MD  fenofibrate micronized (LOFIBRA) 134 MG capsule TAKE 1 CAPSULE(134 MG) BY MOUTH DAILY BEFORE BREAKFAST 06/25/16  Yes Eustaquio BoydenJavier Gutierrez, MD  allopurinol (ZYLOPRIM) 300 MG tablet Take 1 tablet (300 mg total) by mouth daily. 09/09/16 10/09/16  Lucia EstelleFeng Marissah Vandemark, NP  colchicine 0.6 MG tablet Take 1 tablet (0.6 mg total) by mouth daily. 09/09/16 09/10/16  Lucia EstelleFeng Leonette Tischer, NP  indomethacin (INDOCIN) 25 MG capsule Take 1 capsule (25 mg total) by mouth 2 (two) times daily. 09/09/16 09/14/16  Lucia EstelleFeng Kassidi Elza, NP   Meds Ordered and Administered this Visit  Medications - No data to display  BP (!) 129/97 (BP Location: Left Arm)   Pulse 73   Temp 97.9 F (36.6 C) (Oral)   Resp 16   Ht 5\' 8"  (1.727 m)   Wt 202 lb (91.6 kg)   SpO2 100%   BMI 30.71 kg/m  No data found.   Physical Exam  Constitutional: He is oriented to person, place, and time. He appears well-developed and well-nourished.  Cardiovascular: Normal rate.   Pulmonary/Chest: Effort normal.  Musculoskeletal:  No swelling or deformity noted on left foot. Left lateral aspect of the foot is tender to palpate.  Neurological:  He is alert and oriented to person, place, and time.  Nursing note and vitals reviewed.   Urgent Care Course   Clinical Course     Procedures (including critical care time)  Labs Review Labs Reviewed - No data to display  Imaging Review No results found.  MDM   1. Acute gout of left foot, unspecified cause    Patient frequently gets gout flareup. He is adamant today that his pain is a gout flare up. Will treat with colchicine 1.2 mg now, and repeat 1 tablet in one hour. Colchicine offered in clinic, but patient declined and would rather get a prescription.   Will also start patient on indomethacin 25 mg twice a day for the next  5 days. Will also increased allopurinol to 300 mg daily. Informed to follow up with PCP in 1 to 2 weeks for follow-up     Lucia EstelleFeng Minola Guin, NP 09/09/16 1229

## 2016-09-09 NOTE — Discharge Instructions (Signed)
Take colchicine and 2 tablets now, repeat 1 tablet one hour.  Take indomethacin twice a day for the next 5 days.  Increase your allopurinol after your gout flare up has resolved, to 300 mg daily.   And with the primary care doctor in 1-2 weeks for follow-up.

## 2016-09-09 NOTE — ED Triage Notes (Signed)
Patient c/o gout flared up in left foot.

## 2016-09-11 ENCOUNTER — Telehealth: Payer: Self-pay | Admitting: Family Medicine

## 2016-09-11 NOTE — Telephone Encounter (Signed)
Seen at ER

## 2016-09-11 NOTE — Telephone Encounter (Signed)
Patient Name: Shawn Price Gender: Male DOB: 1971/08/01 Age: 46 Y 7 M 3 D Return Phone Number: [phone] (Primary) Address: City/State/Zip: [place] Client [facility] - Client [facility] Night Physician Eustaquio BoydenGutierrez, Javier - MD Contact Type Call Who Is Calling Patient / Member / Family / Caregiver Call Type Triage / Clinical Relationship To Patient Self Return Phone Number [phone] (Primary) Chief Complaint Foot Pain Reason for Call Symptomatic / Request for Health Information Initial Comment Caller states eh is getting gout in his foot again, medication that MD put him on is not working, (Alepurinol preventative) kept it away for 8 months. Colcrys helped, but lingered. He is asking for Colcrys to try and knock this out. PreDisposition Go to Urgent Care/Walk-In Clinic Translation No Nurse Assessment Nurse: Massengill, RN, Casimiro NeedleMichael Date/Time (Eastern Time): 09/09/2016 8:58:16 AM Confirm and document reason for call. If symptomatic, describe symptoms. ---Caller states he has gout. Is painful today. Takes Allopurinol for preventative, takes Colcrys during flareups. Also takes ibuprofen. Does the patient have any new or worsening symptoms? ---Yes Will a triage be completed? ---Yes Related visit to physician within the last 2 weeks? ---No Does the PT have any chronic conditions? (i.e. diabetes, asthma, etc.) ---Yes List chronic conditions. ---Gout High Blood Pressure High Cholesterol Is this a behavioral health or substance abuse call? ---No Guidelines Guideline Title Affirmed Question Affirmed Notes Nurse Date/Time (Eastern Time) Foot Pain Pain in the big toe joint Massengill, RN, Casimiro NeedleMichael 09/09/2016 9:02:21 AM Disp. Time Lamount Cohen(Eastern Time) Disposition Final User 09/09/2016 9:08:56 AM See PCP When Office is Open (within 3 days) Yes Massengill, RN, Casimiro NeedleMichael PLEASE NOTE: All timestamps contained within this report are  represented as [place] Standard Time. CONFIDENTIALTY NOTICE: This fax transmission is intended only for the addressee. It contains information that is legally privileged, confidential or otherwise protected from use or disclosure. If you are not the intended recipient, you are strictly prohibited from reviewing, disclosing, copying using or disseminating any of this information or taking any action in reliance on or regarding this information. If you have received this fax in error, please notify us immediately by telephone so that we can arrange for its return to us. Phone: [phone], Toll-Free: [phone], Fax: [phone] Page: 2 of 2 Call Id: [id number] Caller Understands: Yes Disagree/Comply: Comply Care Advice Given Per Guideline SEE PCP WITHIN 3 DAYS: * You need to be seen within 2 or 3 days. Call your doctor during regular office hours and make an appointment. An urgent care center is often the best source of care if your doctor's office is closed or you can't get an appointment. NOTE: If office will be open tomorrow, tell caller to call then, not in 3 days. PAIN MEDICINES: * For pain relief, take acetaminophen, ibuprofen, or naproxen. IBUPROFEN (E.G., MOTRIN, ADVIL): * Take 400 mg (two 200 mg pills) by mouth every 6 hours as needed. * Another choice is to take 600 mg (three 200 mg pills) by mouth every 8 hours as needed. * The most you should take each day is 1,200 mg (six 200 mg pills a day), unless your doctor has told you to take more. CALL BACK IF: * Fever occurs * You become worse. CARE ADVICE given per Foot Pain (Adult) guideline.

## 2016-09-25 ENCOUNTER — Other Ambulatory Visit: Payer: Self-pay | Admitting: Family Medicine

## 2016-10-07 IMAGING — CR DG FOOT COMPLETE 3+V*L*
4 series · 4 of 4 positions shown · non-contrast
Comparison: None.

CLINICAL DATA: Pt began having pain/swelling/redness over lat mid
foot area near base of 5th MT bone 3 weeks ago. Pt is police officer
and only remembers one incident where he may have injured it and
that was when he took off chasing someone on foot. Pain got a lot
worse 2 days ago

EXAM:
LEFT FOOT - COMPLETE 3+ VIEW

[foot ap]
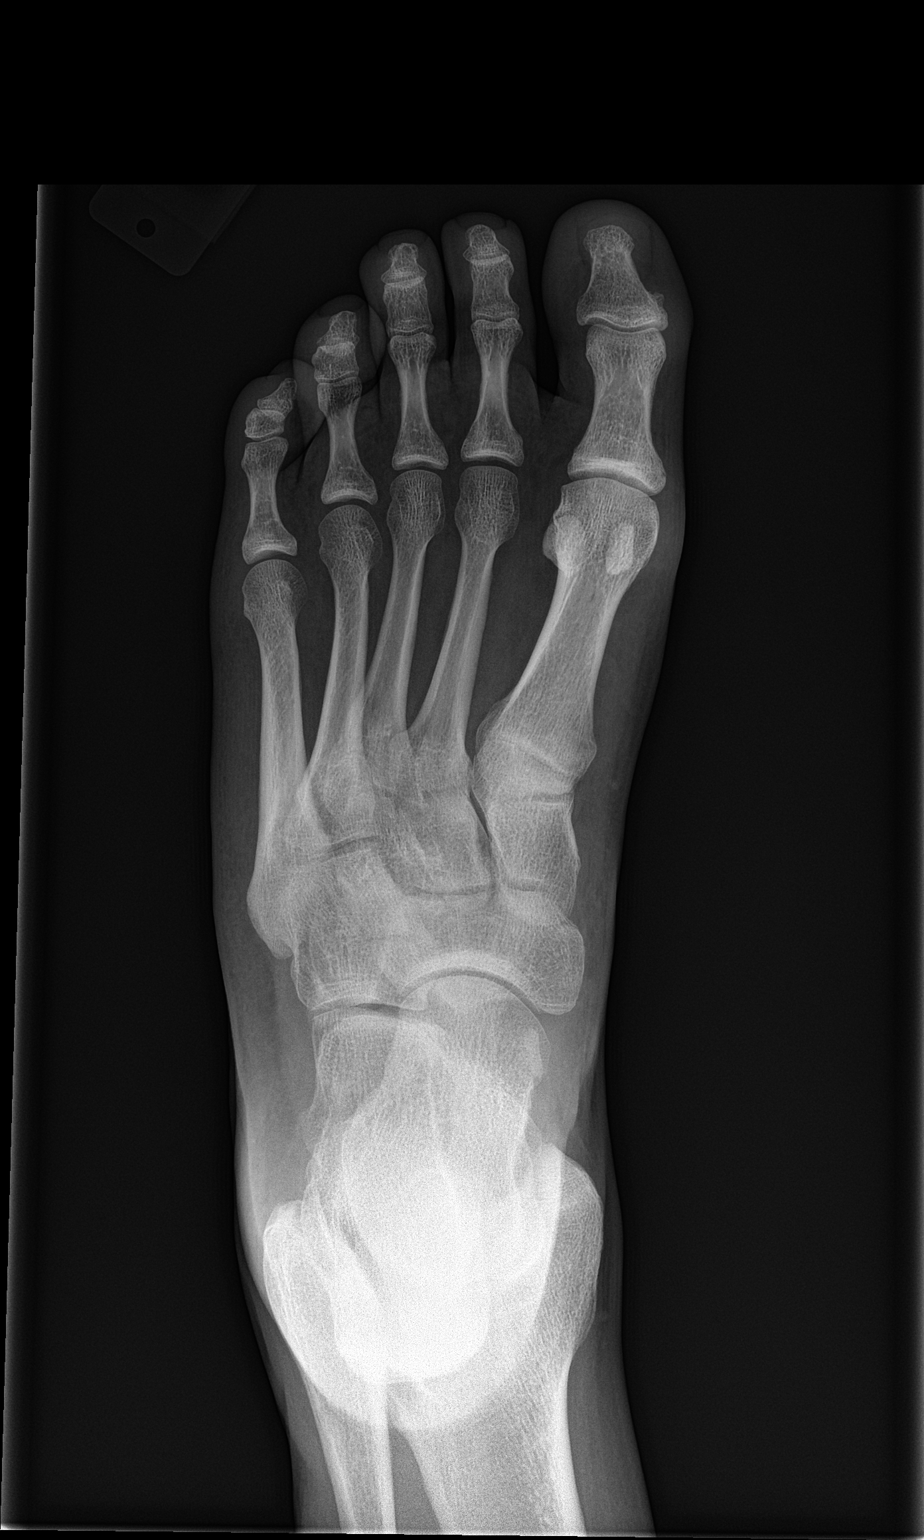

[foot obl (1 of 2)]
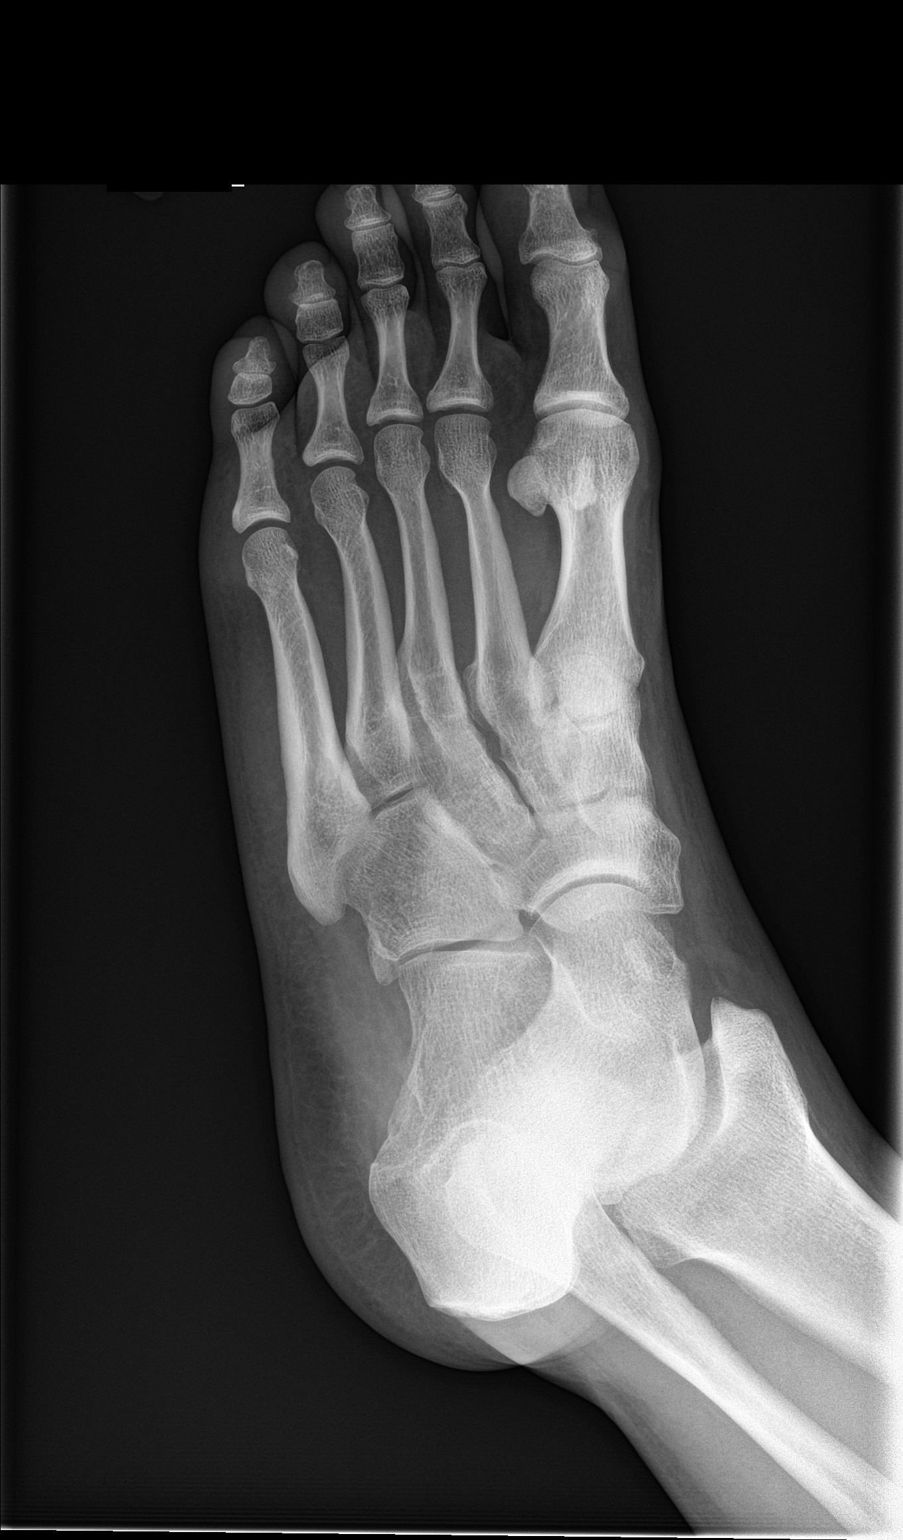

[foot obl (2 of 2)]
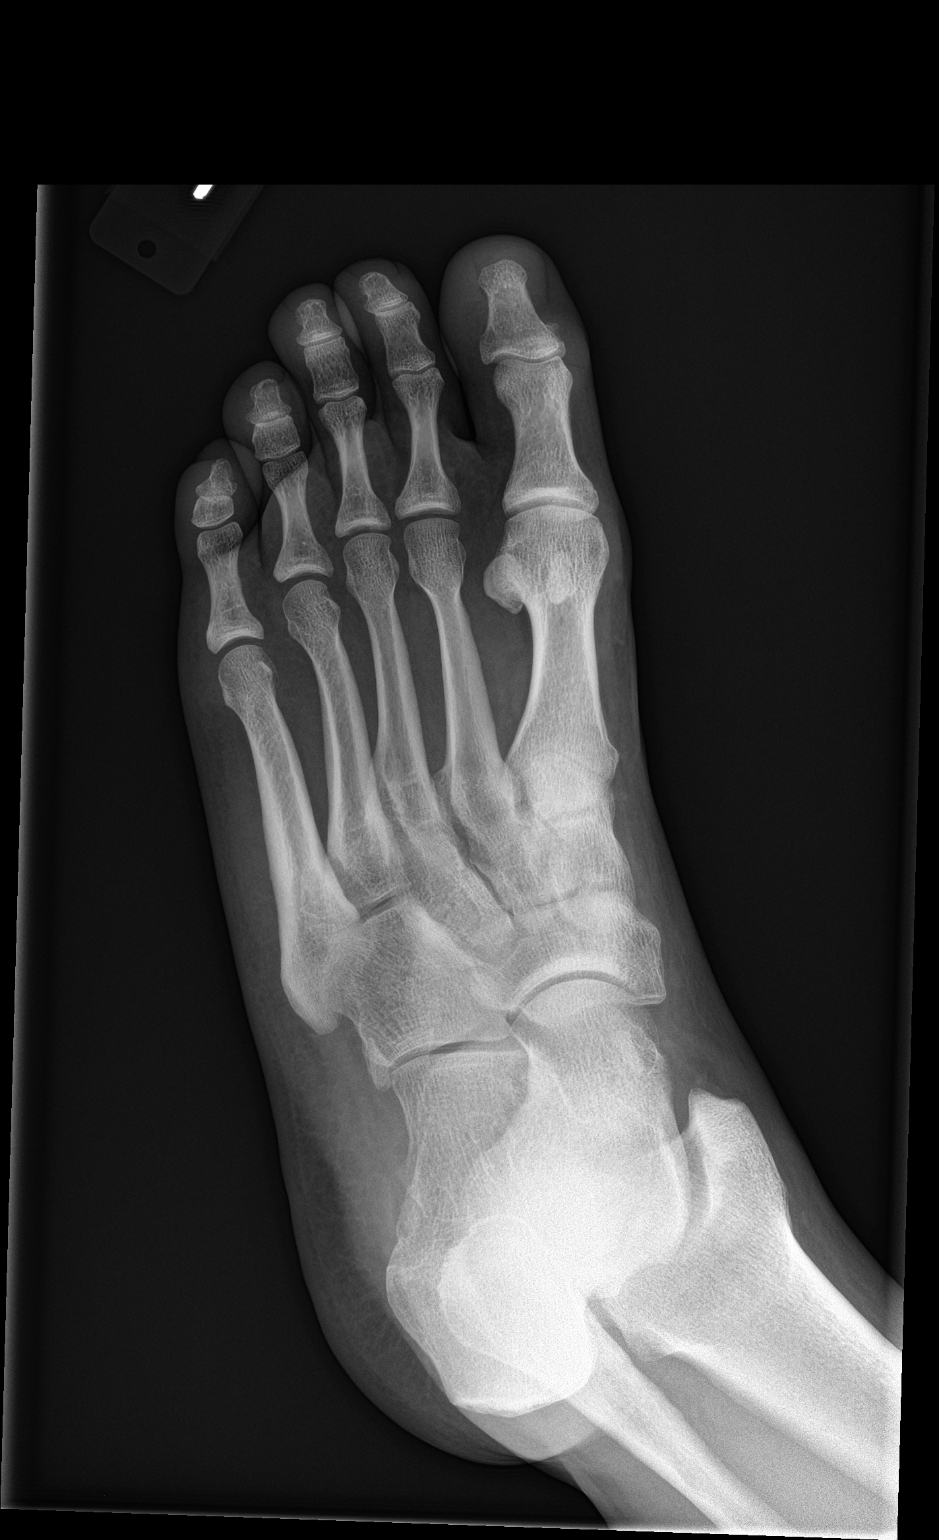

[foot lat]
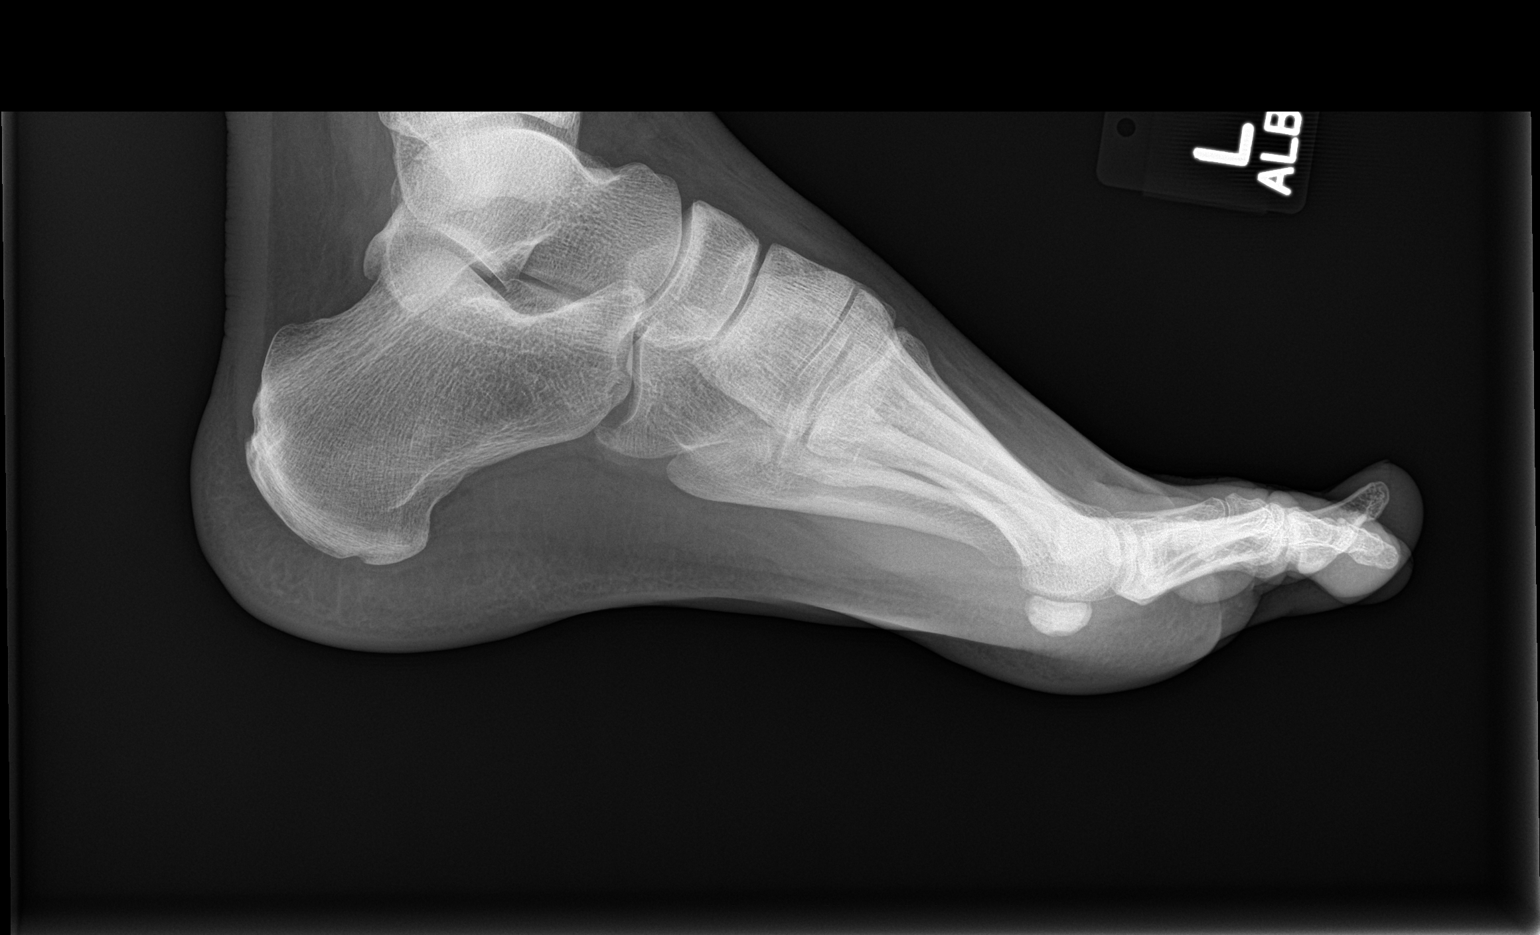

[4 of 4 positions shown; findings below may reference images not displayed]

FINDINGS: There is mild soft tissue swelling in the region of the fifth
metatarsal. No acute fracture or subluxation however. No radiopaque
foreign body or soft tissue gas.
IMPRESSION: Soft tissue swelling without acute fracture.

## 2016-12-23 ENCOUNTER — Other Ambulatory Visit: Payer: Self-pay | Admitting: Family Medicine

## 2017-02-11 ENCOUNTER — Other Ambulatory Visit: Payer: Self-pay | Admitting: Family Medicine

## 2017-04-02 ENCOUNTER — Other Ambulatory Visit: Payer: Self-pay | Admitting: Family Medicine

## 2017-04-26 ENCOUNTER — Other Ambulatory Visit: Payer: Self-pay | Admitting: Family Medicine

## 2017-04-26 NOTE — Telephone Encounter (Signed)
Last Rx 04/02/2017. Last OV 05/2016

## 2017-05-23 ENCOUNTER — Encounter: Payer: Self-pay | Admitting: Family Medicine

## 2017-05-23 ENCOUNTER — Ambulatory Visit (INDEPENDENT_AMBULATORY_CARE_PROVIDER_SITE_OTHER): Payer: BLUE CROSS/BLUE SHIELD | Admitting: Family Medicine

## 2017-05-23 VITALS — BP 122/78 | HR 68 | Temp 98.2°F | Wt 206.2 lb

## 2017-05-23 DIAGNOSIS — Z0000 Encounter for general adult medical examination without abnormal findings: Secondary | ICD-10-CM

## 2017-05-23 DIAGNOSIS — M1A09X0 Idiopathic chronic gout, multiple sites, without tophus (tophi): Secondary | ICD-10-CM | POA: Diagnosis not present

## 2017-05-23 DIAGNOSIS — I10 Essential (primary) hypertension: Secondary | ICD-10-CM

## 2017-05-23 DIAGNOSIS — E785 Hyperlipidemia, unspecified: Secondary | ICD-10-CM | POA: Diagnosis not present

## 2017-05-23 DIAGNOSIS — L819 Disorder of pigmentation, unspecified: Secondary | ICD-10-CM | POA: Diagnosis not present

## 2017-05-23 DIAGNOSIS — R739 Hyperglycemia, unspecified: Secondary | ICD-10-CM | POA: Diagnosis not present

## 2017-05-23 LAB — HEMOGLOBIN A1C: Hgb A1c MFr Bld: 5.6 % (ref 4.6–6.5)

## 2017-05-23 LAB — LIPID PANEL
Cholesterol: 273 mg/dL — ABNORMAL HIGH (ref 0–200)
HDL: 50 mg/dL (ref >39.00)
NonHDL: 223.49
Total CHOL/HDL Ratio: 5
Triglycerides: 258 mg/dL — ABNORMAL HIGH (ref 0.0–149.0)
VLDL: 51.6 mg/dL — ABNORMAL HIGH (ref 0.0–40.0)

## 2017-05-23 LAB — BASIC METABOLIC PANEL
BUN: 14 mg/dL (ref 6–23)
CO2: 27 mEq/L (ref 19–32)
Calcium: 10.6 mg/dL — ABNORMAL HIGH (ref 8.4–10.5)
Chloride: 103 mEq/L (ref 96–112)
Creatinine, Ser: 1.16 mg/dL (ref 0.40–1.50)
GFR: 72.27 mL/min (ref >60.00)
Glucose, Bld: 100 mg/dL — ABNORMAL HIGH (ref 70–99)
Potassium: 4.4 mEq/L (ref 3.5–5.1)
Sodium: 137 mEq/L (ref 135–145)

## 2017-05-23 LAB — URIC ACID: Uric Acid, Serum: 6.9 mg/dL (ref 4.0–7.8)

## 2017-05-23 LAB — TSH: TSH: 3.52 u[IU]/mL (ref 0.35–4.50)

## 2017-05-23 LAB — LDL CHOLESTEROL, DIRECT: Direct LDL: 184 mg/dL

## 2017-05-23 MED ORDER — COLCHICINE 0.6 MG PO TABS: 0.6000 mg | ORAL_TABLET | Freq: Every day | ORAL | 3 refills | Status: DC | PRN

## 2017-05-23 MED ORDER — ALLOPURINOL 100 MG PO TABS: 200.0000 mg | ORAL_TABLET | Freq: Every day | ORAL | 3 refills | Status: DC

## 2017-05-23 MED ORDER — FENOFIBRATE MICRONIZED 134 MG PO CAPS: ORAL_CAPSULE | ORAL | 3 refills | Status: DC

## 2017-05-23 MED ORDER — AMLODIPINE BESYLATE 2.5 MG PO TABS: ORAL_TABLET | ORAL | 3 refills | Status: DC

## 2017-05-23 NOTE — Assessment & Plan Note (Signed)
Chronic, stable on fibrate. Update FLP today.

## 2017-05-23 NOTE — Assessment & Plan Note (Signed)
Unclear etiology, not quite consistent with tinea versicolor. Pt states prior eval by derm - will request records to review. Then determine treatment plan.

## 2017-05-23 NOTE — Addendum Note (Signed)
Addended by: Alvina ChouWALSH, Gael Londo J on: 05/23/2017 11:16 AM   Modules accepted: Orders

## 2017-05-23 NOTE — Assessment & Plan Note (Signed)
Preventative protocols reviewed and updated unless pt declined. Discussed healthy diet and lifestyle.  

## 2017-05-23 NOTE — Progress Notes (Signed)
BP 122/78 (BP Location: Left Arm, Patient Position: Sitting, Cuff Size: Large)   Pulse 68   Temp 98.2 F (36.8 C) (Oral)   Wt 206 lb 4 oz (93.6 kg)   SpO2 98%   BMI 31.36 kg/m    CC: CPE Subjective:    Patient ID: Shawn Price, male    DOB: 1971/07/08, 46 y.o.   MRN: [id number]  HPI: Shawn Price is a 46 y.o. male presenting on 05/23/2017 for Annual Exam (Wants to discuss allopurinol increase)   Gout - on allopurinol 100mg  daily. He has had several gout flares this past year s/p [facility] eval with toradol IM. Requests colchicine refilled. Last flare was 6 months ago.   Marked hypertriglyceridemia - on lofibra.  HTN - amlodipine 2.5mg  daily was started.   Hypopigmented macules on back and abdomen, some on arm. Has seen dermatologist in the past. 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   Asks about testosterone replacement.   Planning on changing to low carb diet. Motivated to lose weight for upcoming PoPat.   Preventative: Flu shot yearly - declines today.  Td -  Seat belt use discussed Sunscreen use discussed. No changing moles. Non smoker Alcohol - cutting down on beers due to gout.   Remarried this year (2014)  Lives with 2nd wife, 2 daughters and step son, 1 dog  Occupation: Emergency planning/management officerpolice officer  Edu: bachelor degree  Activity: runs a few times a week  Diet: good water, some fruits/vegetables, wants to start low carb diet   Relevant past medical, surgical, family and social history reviewed and updated as indicated. Interim medical history since our last visit reviewed. Allergies and medications reviewed and updated. Outpatient Medications Prior to Visit  Medication Sig Dispense Refill  . Ciclopirox 1 % shampoo   3  . allopurinol (ZYLOPRIM) 100 MG tablet TAKE 1 TABLET BY MOUTH DAILY. START ONLY WHEN GOUT FLARE COMPLETELY OVER 30 tablet 6  . amLODipine (NORVASC) 2.5 MG tablet TAKE 1 TABLET(2.5 MG) BY MOUTH DAILY 90 tablet 1  .  fenofibrate micronized (LOFIBRA) 134 MG capsule TAKE 1 CAPSULE(134 MG) BY MOUTH DAILY BEFORE BREAKFAST 90 capsule 1  . allopurinol (ZYLOPRIM) 300 MG tablet Take 1 tablet (300 mg total) by mouth daily. 30 tablet 1  . colchicine 0.6 MG tablet Take 1 tablet (0.6 mg total) by mouth daily. 3 tablet 0   No facility-administered medications prior to visit.      Per HPI unless specifically indicated in ROS section below Review of Systems  Constitutional: Negative for activity change, appetite change, chills, fatigue, fever and unexpected weight change.  HENT: Negative for hearing loss.   Eyes: Negative for visual disturbance.  Respiratory: Negative for cough, chest tightness, shortness of breath and wheezing.   Cardiovascular: Negative for chest pain, palpitations and leg swelling.  Gastrointestinal: Negative for abdominal distention, abdominal pain, blood in stool, constipation, diarrhea, nausea and vomiting.  Genitourinary: Negative for difficulty urinating and hematuria.  Musculoskeletal: Negative for arthralgias, myalgias and neck pain.  Skin: Negative for rash.  Neurological: Negative for dizziness, seizures, syncope and headaches.  Hematological: Negative for adenopathy. Does not bruise/bleed easily.  Psychiatric/Behavioral: Negative for dysphoric mood. The patient is not nervous/anxious.        Objective:    BP 122/78 (BP Location: Left Arm, Patient Position: Sitting, Cuff Size: Large)   Pulse 68   Temp 98.2 F (36.8 C) (Oral)   Wt 206 lb 4 oz (  93.6 kg)   SpO2 98%   BMI 31.36 kg/m   Wt Readings from Last 3 Encounters:  05/23/17 206 lb 4 oz (93.6 kg)  09/09/16 202 lb (91.6 kg)  05/21/16 210 lb 12 oz (95.6 kg)    Physical Exam  Constitutional: He is oriented to person, place, and time. He appears well-developed and well-nourished. No distress.  HENT:  Head: Normocephalic and atraumatic.  Right Ear: Hearing, tympanic membrane, external ear and ear canal normal.  Left Ear:  Hearing, tympanic membrane, external ear and ear canal normal.  Nose: Nose normal.  Mouth/Throat: Uvula is midline, oropharynx is clear and moist and mucous membranes are normal. No oropharyngeal exudate, posterior oropharyngeal edema or posterior oropharyngeal erythema.  Eyes: Pupils are equal, round, and reactive to light. Conjunctivae and EOM are normal. No scleral icterus.  Neck: Normal range of motion. Neck supple. No thyromegaly present.  Cardiovascular: Normal rate, regular rhythm, normal heart sounds and intact distal pulses.   No murmur heard. Pulses:      Radial pulses are 2+ on the right side, and 2+ on the left side.  Pulmonary/Chest: Effort normal and breath sounds normal. No respiratory distress. He has no wheezes. He has no rales.  Abdominal: Soft. Bowel sounds are normal. He exhibits no distension and no mass. There is no tenderness. There is no rebound and no guarding.  Musculoskeletal: Normal range of motion. He exhibits no edema.  Lymphadenopathy:    He has no cervical adenopathy.  Neurological: He is alert and oriented to person, place, and time.  CN grossly intact, station and gait intact  Skin: Skin is warm and dry. Rash noted.  Hypopigmented macules throughout trunk, some faint red papules in center of several. No scaling.  Lesions light up under woods lamp.  Psychiatric: He has a normal mood and affect. His behavior is normal. Judgment and thought content normal.  Nursing note and vitals reviewed.  Results for orders placed or performed in visit on 05/21/16  Lipid panel  Result Value Ref Range   Cholesterol 253 (H) 0 - 200 mg/dL   Triglycerides 086.5 (H) 0.0 - 149.0 mg/dL   HDL 78.46 (L) >96.29 mg/dL   VLDL 52.8 (H) 0.0 - 41.3 mg/dL   Total CHOL/HDL Ratio 7    NonHDL 214.38   Comprehensive metabolic panel  Result Value Ref Range   Sodium 137 135 - 145 mEq/L   Potassium 4.5 3.5 - 5.1 mEq/L   Chloride 102 96 - 112 mEq/L   CO2 28 19 - 32 mEq/L   Glucose, Bld  107 (H) 70 - 99 mg/dL   BUN 15 6 - 23 mg/dL   Creatinine, Ser 2.44 0.40 - 1.50 mg/dL   Total Bilirubin 0.4 0.2 - 1.2 mg/dL   Alkaline Phosphatase 52 39 - 117 U/L   AST 17 0 - 37 U/L   ALT 35 0 - 53 U/L   Total Protein 7.8 6.0 - 8.3 g/dL   Albumin 4.9 3.5 - 5.2 g/dL   Calcium 01.0 8.4 - 27.2 mg/dL   GFR 53.66 >44.03 mL/min  Uric acid  Result Value Ref Range   Uric Acid, Serum 6.9 4.0 - 7.8 mg/dL  LDL cholesterol, direct  Result Value Ref Range   Direct LDL 154.0 mg/dL      Assessment & Plan:   Problem List Items Addressed This Visit    Chronic gout    Doing better on  allopurinol - continue this. Rx colchicine PRN flares.  Relevant Orders   Uric acid   Dyslipidemia    Chronic, stable on fibrate. Update FLP today.       Relevant Medications   fenofibrate micronized (LOFIBRA) 134 MG capsule   Other Relevant Orders   Lipid panel   Basic metabolic panel   TSH   Health maintenance examination - Primary    Preventative protocols reviewed and updated unless pt declined. Discussed healthy diet and lifestyle.       HTN (hypertension)    Chronic, stable. Continue current regimen.       Relevant Medications   amLODipine (NORVASC) 2.5 MG tablet   fenofibrate micronized (LOFIBRA) 134 MG capsule   Hyperglycemia    Update A1c      Relevant Orders   Basic metabolic panel   Hemoglobin A1c   Hypopigmented skin lesion    Unclear etiology, not quite consistent with tinea versicolor. Pt states prior eval by derm - will request records to review. Then determine treatment plan.       Relevant Orders   TSH       Follow up plan: Return in about 1 year (around 05/23/2018) for annual exam, prior fasting for blood work.  Eustaquio Boyden, MD

## 2017-05-23 NOTE — Assessment & Plan Note (Signed)
Doing better on  allopurinol - continue this. Rx colchicine PRN flares.

## 2017-05-23 NOTE — Patient Instructions (Addendum)
Increase allopurinol to 200mg  daily - new dose sent to pharmacy. Colchicine refilled today.  Good to see you today, call us with questions.  Labs today.  Return as needed or in 1 year for next physical.   Health Maintenance, Male A healthy lifestyle and preventive care is important for your health and wellness. Ask your health care provider about what schedule of regular examinations is right for you. What should I know about weight and diet? Eat a Healthy Diet  Eat plenty of vegetables, fruits, whole grains, low-fat dairy products, and lean protein.  Do not eat a lot of foods high in solid fats, added sugars, or salt.  Maintain a Healthy Weight Regular exercise can help you achieve or maintain a healthy weight. You should:  Do at least 150 minutes of exercise each week. The exercise should increase your heart rate and make you sweat (moderate-intensity exercise).  Do strength-training exercises at least twice a week.  Watch Your Levels of Cholesterol and Blood Lipids  Have your blood tested for lipids and cholesterol every 5 years starting at 45 years of age. If you are at high risk for heart disease, you should start having your blood tested when you are 45 years old. You may need to have your cholesterol levels checked more often if: ? Your lipid or cholesterol levels are high. ? You are older than 45 years of age. ? You are at high risk for heart disease.  What should I know about cancer screening? Many types of cancers can be detected early and may often be prevented. Lung Cancer  You should be screened every year for lung cancer if: ? You are a current smoker who has smoked for at least 30 years. ? You are a former smoker who has quit within the past 15 years.  Talk to your health care provider about your screening options, when you should start screening, and how often you should be screened.  Colorectal Cancer  Routine colorectal cancer screening usually begins at 5050  years of age and should be repeated every 5-10 years until you are 45 years old. You may need to be screened more often if early forms of precancerous polyps or small growths are found. Your health care provider may recommend screening at an earlier age if you have risk factors for colon cancer.  Your health care provider may recommend using home test kits to check for hidden blood in the stool.  A small camera at the end of a tube can be used to examine your colon (sigmoidoscopy or colonoscopy). This checks for the earliest forms of colorectal cancer.  Prostate and Testicular Cancer  Depending on your age and overall health, your health care provider may do certain tests to screen for prostate and testicular cancer.  Talk to your health care provider about any symptoms or concerns you have about testicular or prostate cancer.  Skin Cancer  Check your skin from head to toe regularly.  Tell your health care provider about any new moles or changes in moles, especially if: ? There is a change in a mole's size, shape, or color. ? You have a mole that is larger than a pencil eraser.  Always use sunscreen. Apply sunscreen liberally and repeat throughout the day.  Protect yourself by wearing long sleeves, pants, a wide-brimmed hat, and sunglasses when outside.  What should I know about heart disease, diabetes, and high blood pressure?  If you are 7918-45 years of age, have your blood  pressure checked every 3-5 years. If you are 28 years of age or older, have your blood pressure checked every year. You should have your blood pressure measured twice-once when you are at a hospital or clinic, and once when you are not at a hospital or clinic. Record the average of the two measurements. To check your blood pressure when you are not at a hospital or clinic, you can use: ? An automated blood pressure machine at a pharmacy. ? A home blood pressure monitor.  Talk to your health care provider about your  target blood pressure.  If you are between 17-87 years old, ask your health care provider if you should take aspirin to prevent heart disease.  Have regular diabetes screenings by checking your fasting blood sugar level. ? If you are at a normal weight and have a low risk for diabetes, have this test once every three years after the age of 34. ? If you are overweight and have a high risk for diabetes, consider being tested at a younger age or more often.  A one-time screening for abdominal aortic aneurysm (AAA) by ultrasound is recommended for men aged 71-75 years who are current or former smokers. What should I know about preventing infection? Hepatitis B If you have a higher risk for hepatitis B, you should be screened for this virus. Talk with your health care provider to find out if you are at risk for hepatitis B infection. Hepatitis C Blood testing is recommended for:  Everyone born from [birth year] through 1964.  Anyone with known risk factors for hepatitis C.  Sexually Transmitted Diseases (STDs)  You should be screened each year for STDs including gonorrhea and chlamydia if: ? You are sexually active and are younger than 45 years of age. ? You are older than 45 years of age and your health care provider tells you that you are at risk for this type of infection. ? Your sexual activity has changed since you were last screened and you are at an increased risk for chlamydia or gonorrhea. Ask your health care provider if you are at risk.  Talk with your health care provider about whether you are at high risk of being infected with HIV. Your health care provider may recommend a prescription medicine to help prevent HIV infection.  What else can I do?  Schedule regular health, dental, and eye exams.  Stay current with your vaccines (immunizations).  Do not use any tobacco products, such as cigarettes, chewing tobacco, and e-cigarettes. If you need help quitting, ask your health care  provider.  Limit alcohol intake to no more than 2 drinks per day. One drink equals 12 ounces of beer, 5 ounces of wine, or 1 ounces of hard liquor.  Do not use street drugs.  Do not share needles.  Ask your health care provider for help if you need support or information about quitting drugs.  Tell your health care provider if you often feel depressed.  Tell your health care provider if you have ever been abused or do not feel safe at home. This information is not intended to replace advice given to you by your health care provider. Make sure you discuss any questions you have with your health care provider. Document Released: 02/23/2008 Document Revised: 04/25/2016 Document Reviewed: 05/31/2015 Elsevier Interactive Patient Education  [employer].

## 2017-05-23 NOTE — Assessment & Plan Note (Signed)
Chronic, stable. Continue current regimen. 

## 2017-05-23 NOTE — Assessment & Plan Note (Signed)
Update A1c ?

## 2017-07-11 DIAGNOSIS — Z23 Encounter for immunization: Secondary | ICD-10-CM | POA: Diagnosis not present

## 2017-12-11 DIAGNOSIS — L0390 Cellulitis, unspecified: Secondary | ICD-10-CM | POA: Diagnosis not present

## 2017-12-11 DIAGNOSIS — L663 Perifolliculitis capitis abscedens: Secondary | ICD-10-CM | POA: Diagnosis not present

## 2018-01-02 DIAGNOSIS — M7541 Impingement syndrome of right shoulder: Secondary | ICD-10-CM | POA: Diagnosis not present

## 2018-01-02 DIAGNOSIS — S46011A Strain of muscle(s) and tendon(s) of the rotator cuff of right shoulder, initial encounter: Secondary | ICD-10-CM | POA: Diagnosis not present

## 2018-01-16 DIAGNOSIS — L663 Perifolliculitis capitis abscedens: Secondary | ICD-10-CM | POA: Diagnosis not present

## 2018-02-03 ENCOUNTER — Encounter: Payer: Self-pay | Admitting: Family Medicine

## 2018-02-03 DIAGNOSIS — L663 Perifolliculitis capitis abscedens: Secondary | ICD-10-CM

## 2018-02-03 HISTORY — DX: Perifolliculitis capitis abscedens: L66.3

## 2018-04-28 DIAGNOSIS — H109 Unspecified conjunctivitis: Secondary | ICD-10-CM | POA: Diagnosis not present

## 2018-04-28 DIAGNOSIS — Z6834 Body mass index (BMI) 34.0-34.9, adult: Secondary | ICD-10-CM | POA: Diagnosis not present

## 2018-05-13 DIAGNOSIS — H10231 Serous conjunctivitis, except viral, right eye: Secondary | ICD-10-CM | POA: Diagnosis not present

## 2018-05-29 ENCOUNTER — Other Ambulatory Visit: Payer: Self-pay | Admitting: Family Medicine

## 2018-08-24 ENCOUNTER — Other Ambulatory Visit: Payer: Self-pay | Admitting: Family Medicine

## 2018-08-25 ENCOUNTER — Other Ambulatory Visit: Payer: Self-pay | Admitting: Family Medicine

## 2018-08-26 NOTE — Telephone Encounter (Signed)
Plz call to schedule annual physical.

## 2018-08-28 NOTE — Telephone Encounter (Signed)
Noted.  Fyi to Dr. G.  

## 2018-08-28 NOTE — Telephone Encounter (Signed)
Left message asking pt to call office  °

## 2018-08-28 NOTE — Telephone Encounter (Signed)
Pt called office and was able to schedule him on 10/06/18 for a medication refill visit. Pt stated he works for the [employer] department and he had to get his physical done at [facility] in [place]. Please advise

## 2018-10-06 ENCOUNTER — Encounter: Payer: Self-pay | Admitting: Family Medicine

## 2018-10-06 ENCOUNTER — Ambulatory Visit (INDEPENDENT_AMBULATORY_CARE_PROVIDER_SITE_OTHER): Payer: BLUE CROSS/BLUE SHIELD | Admitting: Family Medicine

## 2018-10-06 VITALS — BP 134/78 | HR 92 | Temp 98.2°F | Ht 68.0 in

## 2018-10-06 DIAGNOSIS — R739 Hyperglycemia, unspecified: Secondary | ICD-10-CM

## 2018-10-06 DIAGNOSIS — L663 Perifolliculitis capitis abscedens: Secondary | ICD-10-CM

## 2018-10-06 DIAGNOSIS — I10 Essential (primary) hypertension: Secondary | ICD-10-CM

## 2018-10-06 DIAGNOSIS — E785 Hyperlipidemia, unspecified: Secondary | ICD-10-CM

## 2018-10-06 DIAGNOSIS — M1A09X0 Idiopathic chronic gout, multiple sites, without tophus (tophi): Secondary | ICD-10-CM | POA: Diagnosis not present

## 2018-10-06 MED ORDER — ALLOPURINOL 100 MG PO TABS: 200.0000 mg | ORAL_TABLET | Freq: Every day | ORAL | 4 refills | Status: DC

## 2018-10-06 MED ORDER — AMLODIPINE BESYLATE 2.5 MG PO TABS: ORAL_TABLET | ORAL | 4 refills | Status: DC

## 2018-10-06 MED ORDER — DOXYCYCLINE HYCLATE 100 MG PO CAPS: 100.0000 mg | ORAL_CAPSULE | Freq: Every day | ORAL | 0 refills | Status: DC

## 2018-10-06 MED ORDER — FENOFIBRATE 134 MG PO CAPS: ORAL_CAPSULE | ORAL | 4 refills | Status: DC

## 2018-10-06 NOTE — Assessment & Plan Note (Signed)
Will refill doxycycline 100mg  daily to see effect, encouraged derm f/u.

## 2018-10-06 NOTE — Patient Instructions (Addendum)
Return next week for fasting labs.  May take doxycycline daily until your next appt with derm Other medicines refilled.  Good to see you today.  Return as needed or in 1 year for next physical.

## 2018-10-06 NOTE — Assessment & Plan Note (Signed)
Chronic, stable. Continue current regimen. 

## 2018-10-06 NOTE — Progress Notes (Signed)
BP 134/78 (BP Location: Left Arm, Patient Position: Sitting, Cuff Size: Normal)   Pulse 92   Temp 98.2 F (36.8 C) (Oral)   Ht 5\' 8"  (1.727 m)   SpO2 98%   BMI 31.36 kg/m    CC: med refill  Subjective:    Patient ID: Shawn Price, male    DOB: [birth year], 48 y.o.   MRN: [id number]  HPI: Shawn GitelmanJason M Price is a [age] y.o. male presenting on 10/06/2018 for Medication Refill (Needs refills on maintnence meds. Also, wants to discuss doxycycline. )   Last seen 05/2017 for physical. [facility] last year due to work change. [place] [employer]iff.   Gout - on allopurinol 200mg  daily. Takes colchicine PRN - hasn't needed this year. No recent flares this year.   HTN - on amlodipine 2.5mg  daily, tolerating well. No HA, vision changes, CP/tightness, SOB, leg swelling.   HLD - high triglycerides, on [place] 134mg  daily.   Takes doxycycline for scalp folliculitis - bad flares with hair loss, pain. Was prescribed 100mg  bid. Planning to return to derm. Last dose was this past weekend. Still notices intermittent flares.      Relevant past medical, surgical, family and social history reviewed and updated as indicated. Interim medical history since our last visit reviewed. Allergies and medications reviewed and updated. Outpatient Medications Prior to Visit  Medication Sig Dispense Refill  . allopurinol (ZYLOPRIM) 100 MG tablet TAKE 2 TABLETS BY MOUTH DAILY 180 tablet 0  . amLODipine (NORVASC) 2.5 MG tablet TAKE 1 TABLET BY MOUTH DAILY 90 tablet 0  . doxycycline (VIBRAMYCIN) 100 MG capsule Take 100 mg by mouth 2 (two) times daily.    . fenofibrate micronized (LOFIBRA) 134 MG capsule TAKE ONE CAPSULE BY MOUTH DAILY BEFORE BREAKFAST 90 capsule 0  . colchicine 0.6 MG tablet Take 1 tablet (0.6 mg total) by mouth daily as needed (gout flare). May take 2 tablets for first dose 30 tablet 3  . Ciclopirox 1 % shampoo   3   No facility-administered medications prior to visit.      Per  HPI unless specifically indicated in ROS section below Review of Systems Objective:    BP 134/78 (BP Location: Left Arm, Patient Position: Sitting, Cuff Size: Normal)   Pulse 92   Temp 98.2 F (36.8 C) (Oral)   Ht 5\' 8"  (1.727 m)   SpO2 98%   BMI 31.36 kg/m   Wt Readings from Last 3 Encounters:  05/23/17 206 lb 4 oz (93.6 kg)  09/09/16 202 lb (91.6 kg)  05/21/16 210 lb 12 oz (95.6 kg)    Physical Exam Vitals signs reviewed.  Constitutional:      General: He is not in acute distress.    Appearance: Normal appearance. He is well-developed.  HENT:     Head: Normocephalic and atraumatic.     Comments: Scarring of scalp from prior folliculitis    Mouth/Throat:     Mouth: Mucous membranes are moist.     Pharynx: Oropharynx is clear. No oropharyngeal exudate.  Eyes:     General: No scleral icterus.    Conjunctiva/sclera: Conjunctivae normal.     Pupils: Pupils are equal, round, and reactive to light.  Neck:     Musculoskeletal: Normal range of motion and neck supple.  Cardiovascular:     Rate and Rhythm: Normal rate and regular rhythm.     Heart sounds: Normal heart sounds. No murmur.  Pulmonary:  Effort: No respiratory distress.     Breath sounds: Normal breath sounds. No wheezing or rales.  Skin:    General: Skin is warm and dry.     Findings: No rash.  Neurological:     Mental Status: He is alert.  Psychiatric:        Mood and Affect: Mood normal.        Behavior: Behavior normal.       Results for orders placed or performed in visit on 05/23/17  Basic metabolic panel  Result Value Ref Range   Sodium 137 135 - 145 mEq/L   Potassium 4.4 3.5 - 5.1 mEq/L   Chloride 103 96 - 112 mEq/L   CO2 27 19 - 32 mEq/L   Glucose, Bld 100 (H) 70 - 99 mg/dL   BUN 14 6 - 23 mg/dL   Creatinine, Ser 1.611.16 0.40 - 1.50 mg/dL   Calcium 09.610.6 (H) 8.4 - 10.5 mg/dL   GFR 04.5472.27 >09.81>60.00 mL/min  Hemoglobin A1c  Result Value Ref Range   Hgb A1c MFr Bld 5.6 4.6 - 6.5 %  Uric acid    Result Value Ref Range   Uric Acid, Serum 6.9 4.0 - 7.8 mg/dL  Lipid panel  Result Value Ref Range   Cholesterol 273 (H) 0 - 200 mg/dL   Triglycerides 191.4258.0 (H) 0.0 - 149.0 mg/dL   HDL 78.2950.00 >56.21>39.00 mg/dL   VLDL 30.851.6 (H) 0.0 - 65.740.0 mg/dL   Total CHOL/HDL Ratio 5    NonHDL 223.49   TSH  Result Value Ref Range   TSH 3.52 0.35 - 4.50 uIU/mL  LDL cholesterol, direct  Result Value Ref Range   Direct LDL 184.0 mg/dL   Assessment & Plan:   Problem List Items Addressed This Visit    Hyperglycemia   Relevant Orders   Hemoglobin A1c   HTN (hypertension) - Primary    Chronic, stable. Continue current regimen.       Relevant Medications   amLODipine (NORVASC) 2.5 MG tablet   fenofibrate micronized (LOFIBRA) 134 MG capsule   Other Relevant Orders   Microalbumin / creatinine urine ratio   Dyslipidemia    Chronic, stable on fibrate. Refilled. Update FLP when returns next week fasting.      Relevant Medications   fenofibrate micronized (LOFIBRA) 134 MG capsule   Other Relevant Orders   Lipid panel   Comprehensive metabolic panel   Chronic gout    Chronic, stable. Continue 200mg  allopurinol - no recent gout flare, no recent need for colchicine.      Relevant Orders   Uric acid   Cellulitis, scalp, dissecting    Will refill doxycycline 100mg  daily to see effect, encouraged derm f/u.           Meds ordered this encounter  Medications  . amLODipine (NORVASC) 2.5 MG tablet    Sig: TAKE 1 TABLET BY MOUTH DAILY    Dispense:  90 tablet    Refill:  4  . fenofibrate micronized (LOFIBRA) 134 MG capsule    Sig: TAKE ONE CAPSULE BY MOUTH DAILY BEFORE BREAKFAST    Dispense:  90 capsule    Refill:  4  . allopurinol (ZYLOPRIM) 100 MG tablet    Sig: Take 2 tablets (200 mg total) by mouth daily.    Dispense:  180 tablet    Refill:  4  . doxycycline (VIBRAMYCIN) 100 MG capsule    Sig: Take 1 capsule (100 mg total) by mouth daily.    Dispense:  30 capsule    Refill:  0   Orders  Placed This Encounter  Procedures  . Lipid panel    Standing Status:   Future    Standing Expiration Date:   10/07/2019  . Comprehensive metabolic panel    Standing Status:   Future    Standing Expiration Date:   10/07/2019  . Hemoglobin A1c    Standing Status:   Future    Standing Expiration Date:   10/07/2019  . Uric acid    Standing Status:   Future    Standing Expiration Date:   10/07/2019  . Microalbumin / creatinine urine ratio    Standing Status:   Future    Standing Expiration Date:   10/07/2019    Follow up plan: Return in about 1 year (around 10/07/2019) for annual exam, prior fasting for blood work.  Eustaquio Boyden, MD

## 2018-10-06 NOTE — Assessment & Plan Note (Addendum)
Chronic, stable on fibrate. Refilled. Update FLP when returns next week fasting.

## 2018-10-06 NOTE — Assessment & Plan Note (Signed)
Chronic, stable. Continue 200mg  allopurinol - no recent gout flare, no recent need for colchicine.

## 2018-10-13 DIAGNOSIS — M7541 Impingement syndrome of right shoulder: Secondary | ICD-10-CM | POA: Diagnosis not present

## 2018-10-16 ENCOUNTER — Other Ambulatory Visit (INDEPENDENT_AMBULATORY_CARE_PROVIDER_SITE_OTHER): Payer: BLUE CROSS/BLUE SHIELD

## 2018-10-16 DIAGNOSIS — E785 Hyperlipidemia, unspecified: Secondary | ICD-10-CM | POA: Diagnosis not present

## 2018-10-16 DIAGNOSIS — M1A09X0 Idiopathic chronic gout, multiple sites, without tophus (tophi): Secondary | ICD-10-CM

## 2018-10-16 DIAGNOSIS — I10 Essential (primary) hypertension: Secondary | ICD-10-CM | POA: Diagnosis not present

## 2018-10-16 DIAGNOSIS — R739 Hyperglycemia, unspecified: Secondary | ICD-10-CM

## 2018-10-16 LAB — COMPREHENSIVE METABOLIC PANEL
ALT: 23 U/L (ref 0–53)
AST: 12 U/L (ref 0–37)
Albumin: 5.1 g/dL (ref 3.5–5.2)
Alkaline Phosphatase: 59 U/L (ref 39–117)
BUN: 19 mg/dL (ref 6–23)
CO2: 29 mEq/L (ref 19–32)
Calcium: 9.5 mg/dL (ref 8.4–10.5)
Chloride: 103 mEq/L (ref 96–112)
Creatinine, Ser: 1.13 mg/dL (ref 0.40–1.50)
GFR: 69.65 mL/min (ref >60.00)
Glucose, Bld: 105 mg/dL — ABNORMAL HIGH (ref 70–99)
Potassium: 4.1 mEq/L (ref 3.5–5.1)
Sodium: 138 mEq/L (ref 135–145)
Total Bilirubin: 0.3 mg/dL (ref 0.2–1.2)
Total Protein: 7.6 g/dL (ref 6.0–8.3)

## 2018-10-16 LAB — MICROALBUMIN / CREATININE URINE RATIO
Creatinine,U: 207.3 mg/dL
Microalb Creat Ratio: 0.6 mg/g (ref 0.0–30.0)
Microalb, Ur: 1.1 mg/dL (ref 0.0–1.9)

## 2018-10-16 LAB — LIPID PANEL
Cholesterol: 254 mg/dL — ABNORMAL HIGH (ref 0–200)
HDL: 42 mg/dL (ref >39.00)
NonHDL: 212.11
Total CHOL/HDL Ratio: 6
Triglycerides: 272 mg/dL — ABNORMAL HIGH (ref 0.0–149.0)
VLDL: 54.4 mg/dL — ABNORMAL HIGH (ref 0.0–40.0)

## 2018-10-16 LAB — URIC ACID: Uric Acid, Serum: 5.5 mg/dL (ref 4.0–7.8)

## 2018-10-16 LAB — LDL CHOLESTEROL, DIRECT: Direct LDL: 177 mg/dL

## 2018-10-16 LAB — HEMOGLOBIN A1C: Hgb A1c MFr Bld: 5.4 % (ref 4.6–6.5)

## 2018-11-23 ENCOUNTER — Other Ambulatory Visit: Payer: Self-pay | Admitting: Family Medicine

## 2018-11-25 NOTE — Telephone Encounter (Signed)
Refilled 1 month, rec f/u with derm for further treatment.

## 2018-11-25 NOTE — Telephone Encounter (Signed)
Last office visit 10/06/2018 for Medication refills.  Last refilled 10/06/2018 for #30 with no refills.  No future appointments.  Refill?

## 2018-11-26 ENCOUNTER — Other Ambulatory Visit: Payer: Self-pay | Admitting: Family Medicine

## 2018-11-26 NOTE — Telephone Encounter (Signed)
Spoke with patient. There was some confusion with medication refills and which [facility] pharmacy he is using. He wants to use [place] location and not [facility]. I called [facility] location and cancelled his prescriptions with them so we do not keep getting automatic refill request. Called [place] location and confirmed that they do have prescriptions on file for patient that were filled on 10/06/2018 for 90 day supply with additional refills.

## 2019-06-04 DIAGNOSIS — L663 Perifolliculitis capitis abscedens: Secondary | ICD-10-CM | POA: Diagnosis not present

## 2019-06-04 DIAGNOSIS — Z79899 Other long term (current) drug therapy: Secondary | ICD-10-CM | POA: Diagnosis not present

## 2019-06-10 DIAGNOSIS — Z20828 Contact with and (suspected) exposure to other viral communicable diseases: Secondary | ICD-10-CM | POA: Diagnosis not present

## 2019-06-10 DIAGNOSIS — R05 Cough: Secondary | ICD-10-CM | POA: Diagnosis not present

## 2019-06-10 DIAGNOSIS — R0981 Nasal congestion: Secondary | ICD-10-CM | POA: Diagnosis not present

## 2019-06-10 DIAGNOSIS — Z23 Encounter for immunization: Secondary | ICD-10-CM | POA: Diagnosis not present

## 2019-07-06 DIAGNOSIS — L663 Perifolliculitis capitis abscedens: Secondary | ICD-10-CM | POA: Diagnosis not present

## 2019-07-06 DIAGNOSIS — Z79899 Other long term (current) drug therapy: Secondary | ICD-10-CM | POA: Diagnosis not present

## 2019-08-31 ENCOUNTER — Encounter: Payer: Self-pay | Admitting: Family Medicine

## 2019-09-01 ENCOUNTER — Encounter: Payer: Self-pay | Admitting: Family Medicine

## 2019-09-25 DIAGNOSIS — Z79899 Other long term (current) drug therapy: Secondary | ICD-10-CM | POA: Diagnosis not present

## 2019-09-25 DIAGNOSIS — L663 Perifolliculitis capitis abscedens: Secondary | ICD-10-CM | POA: Diagnosis not present

## 2019-10-22 DIAGNOSIS — L663 Perifolliculitis capitis abscedens: Secondary | ICD-10-CM | POA: Diagnosis not present

## 2019-10-22 DIAGNOSIS — Z79899 Other long term (current) drug therapy: Secondary | ICD-10-CM | POA: Diagnosis not present

## 2019-10-23 DIAGNOSIS — Z23 Encounter for immunization: Secondary | ICD-10-CM | POA: Diagnosis not present

## 2019-11-19 ENCOUNTER — Other Ambulatory Visit: Payer: Self-pay | Admitting: Family Medicine

## 2019-11-19 NOTE — Telephone Encounter (Signed)
Plz schedule cpe and lab visits.  Then return encounter to me for refills.

## 2019-11-20 DIAGNOSIS — Z23 Encounter for immunization: Secondary | ICD-10-CM | POA: Diagnosis not present

## 2019-11-20 NOTE — Telephone Encounter (Signed)
Called and got patient scheduled for CPE and labs. 

## 2019-11-20 NOTE — Telephone Encounter (Signed)
Med refilled once  

## 2019-11-25 ENCOUNTER — Other Ambulatory Visit (INDEPENDENT_AMBULATORY_CARE_PROVIDER_SITE_OTHER): Payer: BC Managed Care – PPO

## 2019-11-25 ENCOUNTER — Other Ambulatory Visit: Payer: Self-pay

## 2019-11-25 ENCOUNTER — Other Ambulatory Visit: Payer: Self-pay | Admitting: Family Medicine

## 2019-11-25 DIAGNOSIS — E785 Hyperlipidemia, unspecified: Secondary | ICD-10-CM | POA: Diagnosis not present

## 2019-11-25 DIAGNOSIS — R739 Hyperglycemia, unspecified: Secondary | ICD-10-CM | POA: Diagnosis not present

## 2019-11-25 DIAGNOSIS — M1A09X0 Idiopathic chronic gout, multiple sites, without tophus (tophi): Secondary | ICD-10-CM | POA: Diagnosis not present

## 2019-11-25 DIAGNOSIS — I10 Essential (primary) hypertension: Secondary | ICD-10-CM

## 2019-11-25 LAB — COMPREHENSIVE METABOLIC PANEL
ALT: 33 U/L (ref 0–53)
AST: 20 U/L (ref 0–37)
Albumin: 4.7 g/dL (ref 3.5–5.2)
Alkaline Phosphatase: 67 U/L (ref 39–117)
BUN: 14 mg/dL (ref 6–23)
CO2: 26 mEq/L (ref 19–32)
Calcium: 10.3 mg/dL (ref 8.4–10.5)
Chloride: 103 mEq/L (ref 96–112)
Creatinine, Ser: 1.09 mg/dL (ref 0.40–1.50)
GFR: 72.26 mL/min (ref >60.00)
Glucose, Bld: 125 mg/dL — ABNORMAL HIGH (ref 70–99)
Potassium: 4.1 mEq/L (ref 3.5–5.1)
Sodium: 137 mEq/L (ref 135–145)
Total Bilirubin: 0.3 mg/dL (ref 0.2–1.2)
Total Protein: 7.3 g/dL (ref 6.0–8.3)

## 2019-11-25 LAB — URIC ACID: Uric Acid, Serum: 5.3 mg/dL (ref 4.0–7.8)

## 2019-11-25 LAB — TSH: TSH: 3.43 u[IU]/mL (ref 0.35–4.50)

## 2019-11-25 LAB — LIPID PANEL
Cholesterol: 276 mg/dL — ABNORMAL HIGH (ref 0–200)
HDL: 28.7 mg/dL — ABNORMAL LOW (ref >39.00)
Total CHOL/HDL Ratio: 10
Triglycerides: 627 mg/dL — ABNORMAL HIGH (ref 0.0–149.0)

## 2019-11-25 LAB — LDL CHOLESTEROL, DIRECT: Direct LDL: 141 mg/dL

## 2019-11-25 LAB — HEMOGLOBIN A1C: Hgb A1c MFr Bld: 5.7 % (ref 4.6–6.5)

## 2019-12-02 ENCOUNTER — Encounter: Payer: Self-pay | Admitting: Family Medicine

## 2019-12-02 ENCOUNTER — Other Ambulatory Visit: Payer: Self-pay

## 2019-12-02 ENCOUNTER — Ambulatory Visit (INDEPENDENT_AMBULATORY_CARE_PROVIDER_SITE_OTHER): Payer: BC Managed Care – PPO | Admitting: Family Medicine

## 2019-12-02 VITALS — BP 128/90 | HR 89 | Temp 97.5°F | Ht 68.0 in | Wt 209.5 lb

## 2019-12-02 DIAGNOSIS — I10 Essential (primary) hypertension: Secondary | ICD-10-CM | POA: Diagnosis not present

## 2019-12-02 DIAGNOSIS — E669 Obesity, unspecified: Secondary | ICD-10-CM

## 2019-12-02 DIAGNOSIS — Z1211 Encounter for screening for malignant neoplasm of colon: Secondary | ICD-10-CM

## 2019-12-02 DIAGNOSIS — L663 Perifolliculitis capitis abscedens: Secondary | ICD-10-CM

## 2019-12-02 DIAGNOSIS — M1A09X0 Idiopathic chronic gout, multiple sites, without tophus (tophi): Secondary | ICD-10-CM

## 2019-12-02 DIAGNOSIS — E785 Hyperlipidemia, unspecified: Secondary | ICD-10-CM

## 2019-12-02 DIAGNOSIS — Z0000 Encounter for general adult medical examination without abnormal findings: Secondary | ICD-10-CM

## 2019-12-02 DIAGNOSIS — R7303 Prediabetes: Secondary | ICD-10-CM

## 2019-12-02 MED ORDER — AMLODIPINE BESYLATE 2.5 MG PO TABS: 2.5000 mg | ORAL_TABLET | Freq: Every day | ORAL | 3 refills | Status: DC

## 2019-12-02 MED ORDER — ALLOPURINOL 100 MG PO TABS: 200.0000 mg | ORAL_TABLET | Freq: Every day | ORAL | 3 refills | Status: DC

## 2019-12-02 MED ORDER — FENOFIBRATE 134 MG PO CAPS: 134.0000 mg | ORAL_CAPSULE | Freq: Every day | ORAL | 3 refills | Status: DC

## 2019-12-02 NOTE — Assessment & Plan Note (Signed)
Chronic, deteriorated triglycerides - anticipate accutane related. He states he may be completing accutane treatment - pending f/u with derm. Will reassess once we find out if he will remain on or off isotretinoin. He may schedule lab visit to recheck FLP if this is stopped.  The 10-year ASCVD risk score Denman George DC Montez Hageman., et al., 2013) is: 9.2%   Values used to calculate the score:     Age: 49 years     Sex: Male     Is Non-Hispanic African American: No     Diabetic: No     Tobacco smoker: No     Systolic Blood Pressure: 128 mmHg     Is BP treated: Yes     HDL Cholesterol: 28.7 mg/dL     Total Cholesterol: 276 mg/dL

## 2019-12-02 NOTE — Assessment & Plan Note (Signed)
Reviewed with patient. Encouraged limiting added sugars in diet.

## 2019-12-02 NOTE — Assessment & Plan Note (Signed)
Chronic, stable. Continue 200mg  allopurinol which has been effective in preventing gout flares. Not needing colchicine.

## 2019-12-02 NOTE — Assessment & Plan Note (Signed)
Encouraged healthy diet and lifestyle to affect sustainable weight loss.  

## 2019-12-02 NOTE — Assessment & Plan Note (Addendum)
Preventative protocols reviewed and updated unless pt declined. Discussed healthy diet and lifestyle.  Completed covid vaccines! Discussed colon cancer screening given new USPSTF guidelines.

## 2019-12-02 NOTE — Assessment & Plan Note (Signed)
Chronic, stable. Continue current regimen. 

## 2019-12-02 NOTE — Assessment & Plan Note (Signed)
Has done well on accutane but affecting cholesterol levels. Close f/u planned with derm.

## 2019-12-02 NOTE — Progress Notes (Signed)
This visit was conducted in person.  BP 128/90 (BP Location: Left Arm, Patient Position: Sitting, Cuff Size: Large)   Pulse 89   Temp (!) 97.5 F (36.4 C) (Temporal)   Ht 5\' 8"  (1.727 m)   Wt 209 lb 8 oz (95 kg)   SpO2 100%   BMI 31.85 kg/m    CC: CPE Subjective:    Patient ID: Shawn Price, male    DOB: 04-Oct-1970, 49 y.o.   MRN: [id number]  HPI: Shawn Price is a 49 y.o. male presenting on 12/02/2019 for Annual Exam (Pt Had 2nd dose Covid vaccine 11/20/19)   Dissecting scalp cellulitis - seeing [facility] derm on accutane with significant improvement.   Anticipate worsening hypertriglyceridemia (despite regular fenofibrate) from accutane.   Does not undergo POPAT. 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  Living in [place], works in [place].   Preventative: Colon cancer screening - discussed options - would like ifob No fmhx prostate cancer Flu shot yearly  Td - will defer for now Moderna COVID vaccine - completed this month! Seat belt use discussed  Sunscreen use discussed. No changing moles. Sees derm.  Non smoker - dips Alcohol - on weekends  Dentist yearly - recent [facility] yearly   Remarried 2014 Lives with wife, 2 daughters and step son, 1 dog  Occupation: 2015 Edu: bachelor degree  Activity: no regular exercise  Diet: good water, some fruits/vegetables, zero soda products, coffee     Relevant past medical, surgical, family and social history reviewed and updated as indicated. Interim medical history since our last visit reviewed. Allergies and medications reviewed and updated. Outpatient Medications Prior to Visit  Medication Sig Dispense Refill  . ISOtretinoin (ACCUTANE) 40 MG capsule Take by mouth.    Database administrator allopurinol (ZYLOPRIM) 100 MG tablet TAKE 2 TABLETS BY MOUTH DAILY 180 tablet 0  . amLODipine (NORVASC) 2.5 MG tablet TAKE 1 TABLET BY MOUTH DAILY 90 tablet 0  . fenofibrate micronized (LOFIBRA) 134 MG capsule TAKE ONE CAPSULE BY MOUTH  DAILY BEFORE BREAKFAST 90 capsule 0  . colchicine 0.6 MG tablet Take 1 tablet (0.6 mg total) by mouth daily as needed (gout flare). May take 2 tablets for first dose 30 tablet 3  . doxycycline (VIBRAMYCIN) 100 MG capsule TAKE ONE CAPSULE BY MOUTH DAILY (Patient not taking: Reported on 12/02/2019) 30 capsule 0   No facility-administered medications prior to visit.     Per HPI unless specifically indicated in ROS section below Review of Systems  Constitutional: Negative for activity change, appetite change, chills, fatigue, fever and unexpected weight change.  HENT: Negative for hearing loss.   Eyes: Negative for visual disturbance.  Respiratory: Negative for cough, chest tightness, shortness of breath and wheezing.   Cardiovascular: Negative for chest pain, palpitations and leg swelling.  Gastrointestinal: Negative for abdominal distention, abdominal pain, blood in stool, constipation, diarrhea, nausea and vomiting.  Genitourinary: Negative for difficulty urinating and hematuria.  Musculoskeletal: Negative for arthralgias, myalgias and neck pain.  Skin: Negative for rash.  Neurological: Positive for headaches (after moderna vaccine - now resolved). Negative for dizziness, seizures and syncope.  Hematological: Negative for adenopathy. Does not bruise/bleed easily.  Psychiatric/Behavioral: Negative for dysphoric mood. The patient is not nervous/anxious.    Objective:    BP 128/90 (BP Location: Left Arm, Patient Position: Sitting, Cuff Size: Large)   Pulse 89   Temp (!) 97.5 F (36.4 C) (Temporal)   Ht 5\' 8"  (1.727 m)  Wt 209 lb 8 oz (95 kg)   SpO2 100%   BMI 31.85 kg/m   Wt Readings from Last 3 Encounters:  12/02/19 209 lb 8 oz (95 kg)  05/23/17 206 lb 4 oz (93.6 kg)  09/09/16 202 lb (91.6 kg)    Physical Exam Vitals and nursing note reviewed.  Constitutional:      General: He is not in acute distress.    Appearance: Normal appearance. He is well-developed. He is not  ill-appearing.  HENT:     Head: Normocephalic and atraumatic.     Right Ear: Hearing, tympanic membrane, ear canal and external ear normal.     Left Ear: Hearing, tympanic membrane, ear canal and external ear normal.     Mouth/Throat:     Pharynx: Uvula midline.  Eyes:     General: No scleral icterus.    Extraocular Movements: Extraocular movements intact.     Conjunctiva/sclera: Conjunctivae normal.     Pupils: Pupils are equal, round, and reactive to light.  Neck:     Thyroid: No thyromegaly or thyroid tenderness.  Cardiovascular:     Rate and Rhythm: Normal rate and regular rhythm.     Pulses: Normal pulses.          Radial pulses are 2+ on the right side and 2+ on the left side.     Heart sounds: Normal heart sounds. No murmur.  Pulmonary:     Effort: Pulmonary effort is normal. No respiratory distress.     Breath sounds: Normal breath sounds. No wheezing, rhonchi or rales.  Abdominal:     General: Abdomen is flat. Bowel sounds are normal. There is no distension.     Palpations: Abdomen is soft. There is no mass.     Tenderness: There is no abdominal tenderness. There is no guarding or rebound.     Hernia: No hernia is present.  Musculoskeletal:        General: Normal range of motion.     Cervical back: Normal range of motion and neck supple.     Right lower leg: No edema.     Left lower leg: No edema.  Lymphadenopathy:     Cervical: No cervical adenopathy.  Skin:    General: Skin is warm and dry.     Findings: No rash.  Neurological:     General: No focal deficit present.     Mental Status: He is alert and oriented to person, place, and time.     Comments: CN grossly intact, station and gait intact  Psychiatric:        Mood and Affect: Mood normal.        Behavior: Behavior normal.        Thought Content: Thought content normal.        Judgment: Judgment normal.       Results for orders placed or performed in visit on 11/25/19  Hemoglobin A1c  Result Value Ref  Range   Hgb A1c MFr Bld 5.7 4.6 - 6.5 %  TSH  Result Value Ref Range   TSH 3.43 0.35 - 4.50 uIU/mL  Uric acid  Result Value Ref Range   Uric Acid, Serum 5.3 4.0 - 7.8 mg/dL  Comprehensive metabolic panel  Result Value Ref Range   Sodium 137 135 - 145 mEq/L   Potassium 4.1 3.5 - 5.1 mEq/L   Chloride 103 96 - 112 mEq/L   CO2 26 19 - 32 mEq/L   Glucose, Bld 125 (H) 70 -  99 mg/dL   BUN 14 6 - 23 mg/dL   Creatinine, Ser 1.09 0.40 - 1.50 mg/dL   Total Bilirubin 0.3 0.2 - 1.2 mg/dL   Alkaline Phosphatase 67 39 - 117 U/L   AST 20 0 - 37 U/L   ALT 33 0 - 53 U/L   Total Protein 7.3 6.0 - 8.3 g/dL   Albumin 4.7 3.5 - 5.2 g/dL   GFR 72.26 >60.00 mL/min   Calcium 10.3 8.4 - 10.5 mg/dL  Lipid panel  Result Value Ref Range   Cholesterol 276 (H) 0 - 200 mg/dL   Triglycerides (H) 0.0 - 149.0 mg/dL    627.0 Triglyceride is over 400; calculations on Lipids are invalid.   HDL 28.70 (L) >39.00 mg/dL   Total CHOL/HDL Ratio 10   LDL cholesterol, direct  Result Value Ref Range   Direct LDL 141.0 mg/dL   EKG - NSR rate 70s, normal axis, intervals, no acute ST/T changes Assessment & Plan:  This visit occurred during the SARS-CoV-2 public health emergency.  Safety protocols were in place, including screening questions prior to the visit, additional usage of staff PPE, and extensive cleaning of exam room while observing appropriate contact time as indicated for disinfecting solutions.   Problem List Items Addressed This Visit    Prediabetes    Reviewed with patient. Encouraged limiting added sugars in diet.       Obesity, Class I, BMI 30-34.9    Encouraged healthy diet and lifestyle to affect sustainable weight loss.       HTN (hypertension)    Chronic, stable. Continue current regimen.       Relevant Medications   fenofibrate micronized (LOFIBRA) 134 MG capsule   amLODipine (NORVASC) 2.5 MG tablet   Health maintenance examination - Primary    Preventative protocols reviewed and updated  unless pt declined. Discussed healthy diet and lifestyle.  Completed covid vaccines! Discussed colon cancer screening given new USPSTF guidelines.       Relevant Orders   EKG 12-Lead (Completed)   Dyslipidemia    Chronic, deteriorated triglycerides - anticipate accutane related. He states he may be completing accutane treatment - pending f/u with derm. Will reassess once we find out if he will remain on or off isotretinoin. He may schedule lab visit to recheck FLP if this is stopped.  The 10-year ASCVD risk score Mikey Bussing DC Brooke Bonito., et al., 2013) is: 9.2%   Values used to calculate the score:     Age: 67 years     Sex: Male     Is Non-Hispanic African American: No     Diabetic: No     Tobacco smoker: No     Systolic Blood Pressure: 762 mmHg     Is BP treated: Yes     HDL Cholesterol: 28.7 mg/dL     Total Cholesterol: 276 mg/dL       Relevant Medications   fenofibrate micronized (LOFIBRA) 134 MG capsule   Chronic gout    Chronic, stable. Continue 200mg  allopurinol which has been effective in preventing gout flares. Not needing colchicine.       Cellulitis, scalp, dissecting    Has done well on accutane but affecting cholesterol levels. Close f/u planned with derm.        Other Visit Diagnoses    Special screening for malignant neoplasms, colon       Relevant Orders   Fecal occult blood, imunochemical       Meds ordered this encounter  Medications  .  fenofibrate micronized (LOFIBRA) 134 MG capsule    Sig: Take 1 capsule (134 mg total) by mouth daily before breakfast.    Dispense:  90 capsule    Refill:  3  . amLODipine (NORVASC) 2.5 MG tablet    Sig: Take 1 tablet (2.5 mg total) by mouth daily.    Dispense:  90 tablet    Refill:  3  . allopurinol (ZYLOPRIM) 100 MG tablet    Sig: Take 2 tablets (200 mg total) by mouth daily.    Dispense:  180 tablet    Refill:  3   Orders Placed This Encounter  Procedures  . Fecal occult blood, imunochemical    Standing Status:    Future    Standing Expiration Date:   12/01/2020  . EKG 12-Lead    Follow up plan: Return in about 1 year (around 12/01/2020), or if symptoms worsen or fail to improve, for annual exam, prior fasting for blood work.  Eustaquio Boyden, MD

## 2019-12-02 NOTE — Patient Instructions (Addendum)
Pass by lab for a stool kit.  EKG today  You are doing well today Triglyceride elevation is likely coming from accutane - if you're going to remain on it, let me know to add second cholesterol medicine (statin).  If you stop accutane, schedule lab visit only 3 months after stopping to repeat cholesterol levels.  Return as needed or in 1 year for next physical.   Health Maintenance, Male Adopting a healthy lifestyle and getting preventive care are important in promoting health and wellness. Ask your health care provider about:  The right schedule for you to have regular tests and exams.  Things you can do on your own to prevent diseases and keep yourself healthy. What should I know about diet, weight, and exercise? Eat a healthy diet   Eat a diet that includes plenty of vegetables, fruits, low-fat dairy products, and lean protein.  Do not eat a lot of foods that are high in solid fats, added sugars, or sodium. Maintain a healthy weight Body mass index (BMI) is a measurement that can be used to identify possible weight problems. It estimates body fat based on height and weight. Your health care provider can help determine your BMI and help you achieve or maintain a healthy weight. Get regular exercise Get regular exercise. This is one of the most important things you can do for your health. Most adults should:  Exercise for at least 150 minutes each week. The exercise should increase your heart rate and make you sweat (moderate-intensity exercise).  Do strengthening exercises at least twice a week. This is in addition to the moderate-intensity exercise.  Spend less time sitting. Even light physical activity can be beneficial. Watch cholesterol and blood lipids Have your blood tested for lipids and cholesterol at 48 years of age, then have this test every 5 years. You may need to have your cholesterol levels checked more often if:  Your lipid or cholesterol levels are high.  You are  older than 48 years of age.  You are at high risk for heart disease. What should I know about cancer screening? Many types of cancers can be detected early and may often be prevented. Depending on your health history and family history, you may need to have cancer screening at various ages. This may include screening for:  Colorectal cancer.  Prostate cancer.  Skin cancer.  Lung cancer. What should I know about heart disease, diabetes, and high blood pressure? Blood pressure and heart disease  High blood pressure causes heart disease and increases the risk of stroke. This is more likely to develop in people who have high blood pressure readings, are of African descent, or are overweight.  Talk with your health care provider about your target blood pressure readings.  Have your blood pressure checked: ? Every 3-5 years if you are 79-20 years of age. ? Every year if you are 67 years old or older.  If you are between the ages of 46 and 31 and are a current or former smoker, ask your health care provider if you should have a one-time screening for abdominal aortic aneurysm (AAA). Diabetes Have regular diabetes screenings. This checks your fasting blood sugar level. Have the screening done:  Once every three years after age 74 if you are at a normal weight and have a low risk for diabetes.  More often and at a younger age if you are overweight or have a high risk for diabetes. What should I know about preventing infection?  Hepatitis B If you have a higher risk for hepatitis B, you should be screened for this virus. Talk with your health care provider to find out if you are at risk for hepatitis B infection. Hepatitis C Blood testing is recommended for:  Everyone born from [birth year] through 1964.  Anyone with known risk factors for hepatitis C. Sexually transmitted infections (STIs)  You should be screened each year for STIs, including gonorrhea and chlamydia, if: ? You are sexually  active and are younger than 49 years of age. ? You are older than 48 years of age and your health care provider tells you that you are at risk for this type of infection. ? Your sexual activity has changed since you were last screened, and you are at increased risk for chlamydia or gonorrhea. Ask your health care provider if you are at risk.  Ask your health care provider about whether you are at high risk for HIV. Your health care provider may recommend a prescription medicine to help prevent HIV infection. If you choose to take medicine to prevent HIV, you should first get tested for HIV. You should then be tested every 3 months for as long as you are taking the medicine. Follow these instructions at home: Lifestyle  Do not use any products that contain nicotine or tobacco, such as cigarettes, e-cigarettes, and chewing tobacco. If you need help quitting, ask your health care provider.  Do not use street drugs.  Do not share needles.  Ask your health care provider for help if you need support or information about quitting drugs. Alcohol use  Do not drink alcohol if your health care provider tells you not to drink.  If you drink alcohol: ? Limit how much you have to 0-2 drinks a day. ? Be aware of how much alcohol is in your drink. In the [place] one drink equals one 12 oz bottle of beer (355 mL), one 5 oz glass of wine (148 mL), or one 1 oz glass of hard liquor (44 mL). General instructions  Schedule regular health, dental, and eye exams.  Stay current with your vaccines.  Tell your health care provider if: ? You often feel depressed. ? You have ever been abused or do not feel safe at home. Summary  Adopting a healthy lifestyle and getting preventive care are important in promoting health and wellness.  Follow your health care provider's instructions about healthy diet, exercising, and getting tested or screened for diseases.  Follow your health care provider's instructions on  monitoring your cholesterol and blood pressure. This information is not intended to replace advice given to you by your health care provider. Make sure you discuss any questions you have with your health care provider. Document Revised: 08/20/2018 Document Reviewed: 08/20/2018 Elsevier Patient Education  2020 [employer].

## 2020-02-16 ENCOUNTER — Other Ambulatory Visit: Payer: Self-pay | Admitting: Family Medicine

## 2020-07-11 DIAGNOSIS — M5416 Radiculopathy, lumbar region: Secondary | ICD-10-CM | POA: Diagnosis not present

## 2020-07-11 DIAGNOSIS — M545 Low back pain, unspecified: Secondary | ICD-10-CM | POA: Diagnosis not present

## 2020-08-03 ENCOUNTER — Encounter: Payer: Self-pay | Admitting: Family Medicine

## 2020-08-03 ENCOUNTER — Other Ambulatory Visit: Payer: Self-pay

## 2020-08-03 ENCOUNTER — Ambulatory Visit: Payer: BC Managed Care – PPO | Admitting: Family Medicine

## 2020-08-03 DIAGNOSIS — Z0279 Encounter for issue of other medical certificate: Secondary | ICD-10-CM | POA: Insufficient documentation

## 2020-08-03 NOTE — Progress Notes (Signed)
Patient ID: Shawn Price, male    DOB: January 05, 1971, 49 y.o.   MRN: [id number]  This visit was conducted in person.  BP 124/78 (BP Location: Left Arm, Patient Position: Sitting, Cuff Size: Large)   Pulse 100   Temp 97.9 F (36.6 C) (Temporal)   Ht 5\' 8"  (1.727 m)   SpO2 98%   BMI 31.85 kg/m    CC: complete respirator form  Subjective:   HPI: Shawn Price is a 48 y.o. male presenting on 08/03/2020 for Form Completion (Needs form completed for respirator fitting. )   08/05/2020.  Presents to fill out Respirator Medical Evaluation Form certifying he is capable of using full-face Air-purifying respirator and self-contained breathing apparatus.   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   Has used these respirators previously without any trouble.  No known lung disease history.  Never smoker.  No known asbestos exposure.  No dyspnea, cough, wheezing, fever.  No asthma history.   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   Tachycardic - has been drinking caffeinated sodas today.      Relevant past medical, surgical, family and social history reviewed and updated as indicated. Interim medical history since our last visit reviewed. Allergies and medications reviewed and updated. Outpatient Medications Prior to Visit  Medication Sig Dispense Refill  . allopurinol (ZYLOPRIM) 100 MG tablet TAKE 2 TABLETS BY MOUTH DAILY 180 tablet 3  . amLODipine (NORVASC) 2.5 MG tablet TAKE 1 TABLET BY MOUTH DAILY 90 tablet 3  . colchicine 0.6 MG tablet Take 1 tablet (0.6 mg total) by mouth daily as needed (gout flare). May take 2 tablets for first dose 30 tablet 3  . fenofibrate micronized (LOFIBRA) 134 MG capsule TAKE ONE CAPSULE BY MOUTH DAILY BEFORE BREAKFAST 90 capsule 3  . ISOtretinoin (ACCUTANE) 40 MG capsule Take by mouth.     No facility-administered medications prior to visit.     Per HPI unless  specifically indicated in ROS section below Review of Systems Objective:  BP 124/78 (BP Location: Left Arm, Patient Position: Sitting, Cuff Size: Large)   Pulse 100   Temp 97.9 F (36.6 C) (Temporal)   Ht 5\' 8"  (1.727 m)   SpO2 98%   BMI 31.85 kg/m   Wt Readings from Last 3 Encounters:  12/02/19 209 lb 8 oz (95 kg)  05/23/17 206 lb 4 oz (93.6 kg)  09/09/16 202 lb (91.6 kg)      Physical Exam Vitals and nursing note reviewed.  Constitutional:      Appearance: Normal appearance. He is not ill-appearing.  HENT:     Head: Normocephalic and atraumatic.  Cardiovascular:     Rate and Rhythm: Normal rate and regular rhythm.     Pulses: Normal pulses.     Heart sounds: Normal heart sounds. No murmur heard.   Pulmonary:     Effort: Pulmonary effort is normal. No respiratory distress.     Breath sounds: Normal breath sounds. No wheezing, rhonchi or rales.  Neurological:     Mental Status: He is alert.  Psychiatric:        Mood and Affect: Mood normal.        Behavior: Behavior normal.       Assessment & Plan:  This visit occurred during the SARS-CoV-2 public health emergency.  Safety protocols were in place, including screening questions prior to the visit, additional usage of staff PPE, and extensive  cleaning of exam room while observing appropriate contact time as indicated for disinfecting solutions.   Problem List Items Addressed This Visit    Medical certificate issuance    Respirator medical evaluation form filled out and returned to patient. No concerns identified.           No orders of the defined types were placed in this encounter.  No orders of the defined types were placed in this encounter.   Follow up plan: No follow-ups on file.  Eustaquio Boyden, MD

## 2020-08-03 NOTE — Assessment & Plan Note (Signed)
Respirator medical evaluation form filled out and returned to patient. No concerns identified.

## 2021-01-04 DIAGNOSIS — L663 Perifolliculitis capitis abscedens: Secondary | ICD-10-CM | POA: Diagnosis not present

## 2021-01-10 ENCOUNTER — Encounter: Payer: Self-pay | Admitting: Family Medicine

## 2021-01-10 MED ORDER — ALLOPURINOL 100 MG PO TABS: 200.0000 mg | ORAL_TABLET | Freq: Every day | ORAL | 0 refills | Status: DC

## 2021-01-10 NOTE — Telephone Encounter (Signed)
E-scribed refill.  Plz schedule lab and cpe visits.  

## 2021-01-30 DIAGNOSIS — Z20822 Contact with and (suspected) exposure to covid-19: Secondary | ICD-10-CM | POA: Diagnosis not present

## 2021-02-22 ENCOUNTER — Other Ambulatory Visit: Payer: Self-pay | Admitting: Family Medicine

## 2021-02-22 DIAGNOSIS — M1A09X0 Idiopathic chronic gout, multiple sites, without tophus (tophi): Secondary | ICD-10-CM

## 2021-02-22 DIAGNOSIS — I10 Essential (primary) hypertension: Secondary | ICD-10-CM

## 2021-02-22 DIAGNOSIS — Z1159 Encounter for screening for other viral diseases: Secondary | ICD-10-CM

## 2021-02-22 DIAGNOSIS — R7303 Prediabetes: Secondary | ICD-10-CM

## 2021-02-22 DIAGNOSIS — E785 Hyperlipidemia, unspecified: Secondary | ICD-10-CM

## 2021-02-23 ENCOUNTER — Other Ambulatory Visit: Payer: Self-pay

## 2021-02-23 ENCOUNTER — Other Ambulatory Visit (INDEPENDENT_AMBULATORY_CARE_PROVIDER_SITE_OTHER): Payer: BC Managed Care – PPO

## 2021-02-23 DIAGNOSIS — I10 Essential (primary) hypertension: Secondary | ICD-10-CM | POA: Diagnosis not present

## 2021-02-23 DIAGNOSIS — M1A09X0 Idiopathic chronic gout, multiple sites, without tophus (tophi): Secondary | ICD-10-CM | POA: Diagnosis not present

## 2021-02-23 DIAGNOSIS — R7303 Prediabetes: Secondary | ICD-10-CM

## 2021-02-23 DIAGNOSIS — Z1159 Encounter for screening for other viral diseases: Secondary | ICD-10-CM

## 2021-02-23 DIAGNOSIS — E785 Hyperlipidemia, unspecified: Secondary | ICD-10-CM | POA: Diagnosis not present

## 2021-02-23 LAB — LIPID PANEL
Cholesterol: 265 mg/dL — ABNORMAL HIGH (ref 0–200)
HDL: 38.3 mg/dL — ABNORMAL LOW (ref >39.00)
NonHDL: 226.35
Total CHOL/HDL Ratio: 7
Triglycerides: 396 mg/dL — ABNORMAL HIGH (ref 0.0–149.0)
VLDL: 79.2 mg/dL — ABNORMAL HIGH (ref 0.0–40.0)

## 2021-02-23 LAB — MICROALBUMIN / CREATININE URINE RATIO
Creatinine,U: 110.5 mg/dL
Microalb Creat Ratio: 0.6 mg/g (ref 0.0–30.0)
Microalb, Ur: <0.7 mg/dL (ref 0.0–1.9)

## 2021-02-23 LAB — COMPREHENSIVE METABOLIC PANEL
ALT: 28 U/L (ref 0–53)
AST: 15 U/L (ref 0–37)
Albumin: 4.7 g/dL (ref 3.5–5.2)
Alkaline Phosphatase: 57 U/L (ref 39–117)
BUN: 14 mg/dL (ref 6–23)
CO2: 26 mEq/L (ref 19–32)
Calcium: 9.2 mg/dL (ref 8.4–10.5)
Chloride: 104 mEq/L (ref 96–112)
Creatinine, Ser: 1.12 mg/dL (ref 0.40–1.50)
GFR: 77.39 mL/min (ref >60.00)
Glucose, Bld: 122 mg/dL — ABNORMAL HIGH (ref 70–99)
Potassium: 4 mEq/L (ref 3.5–5.1)
Sodium: 138 mEq/L (ref 135–145)
Total Bilirubin: 0.4 mg/dL (ref 0.2–1.2)
Total Protein: 7.3 g/dL (ref 6.0–8.3)

## 2021-02-23 LAB — LDL CHOLESTEROL, DIRECT: Direct LDL: 177 mg/dL

## 2021-02-23 LAB — URIC ACID: Uric Acid, Serum: 5.5 mg/dL (ref 4.0–7.8)

## 2021-02-23 LAB — HEMOGLOBIN A1C: Hgb A1c MFr Bld: 5.8 % (ref 4.6–6.5)

## 2021-02-24 LAB — HEPATITIS C ANTIBODY
Hepatitis C Ab: NONREACTIVE
SIGNAL TO CUT-OFF: 0 (ref <1.00)

## 2021-03-01 ENCOUNTER — Encounter: Payer: Self-pay | Admitting: Family Medicine

## 2021-03-01 ENCOUNTER — Other Ambulatory Visit: Payer: Self-pay

## 2021-03-01 ENCOUNTER — Ambulatory Visit (INDEPENDENT_AMBULATORY_CARE_PROVIDER_SITE_OTHER): Payer: BC Managed Care – PPO | Admitting: Family Medicine

## 2021-03-01 VITALS — BP 130/80 | HR 92 | Temp 98.0°F | Ht 67.5 in | Wt 223.0 lb

## 2021-03-01 DIAGNOSIS — Z1211 Encounter for screening for malignant neoplasm of colon: Secondary | ICD-10-CM

## 2021-03-01 DIAGNOSIS — M1A09X0 Idiopathic chronic gout, multiple sites, without tophus (tophi): Secondary | ICD-10-CM

## 2021-03-01 DIAGNOSIS — I10 Essential (primary) hypertension: Secondary | ICD-10-CM

## 2021-03-01 DIAGNOSIS — R7303 Prediabetes: Secondary | ICD-10-CM

## 2021-03-01 DIAGNOSIS — E785 Hyperlipidemia, unspecified: Secondary | ICD-10-CM | POA: Diagnosis not present

## 2021-03-01 DIAGNOSIS — L663 Perifolliculitis capitis abscedens: Secondary | ICD-10-CM

## 2021-03-01 DIAGNOSIS — Z0000 Encounter for general adult medical examination without abnormal findings: Secondary | ICD-10-CM | POA: Diagnosis not present

## 2021-03-01 DIAGNOSIS — Z23 Encounter for immunization: Secondary | ICD-10-CM

## 2021-03-01 DIAGNOSIS — E669 Obesity, unspecified: Secondary | ICD-10-CM

## 2021-03-01 MED ORDER — ATORVASTATIN CALCIUM 20 MG PO TABS: 20.0000 mg | ORAL_TABLET | Freq: Every day | ORAL | 3 refills | Status: DC

## 2021-03-01 MED ORDER — AMLODIPINE BESYLATE 2.5 MG PO TABS: 2.5000 mg | ORAL_TABLET | Freq: Every day | ORAL | 3 refills | Status: DC

## 2021-03-01 MED ORDER — FENOFIBRATE 134 MG PO CAPS: 134.0000 mg | ORAL_CAPSULE | Freq: Every day | ORAL | 3 refills | Status: DC

## 2021-03-01 MED ORDER — ALLOPURINOL 100 MG PO TABS: 200.0000 mg | ORAL_TABLET | Freq: Every day | ORAL | 3 refills | Status: DC

## 2021-03-01 NOTE — Assessment & Plan Note (Signed)
Appreciate derm care.  

## 2021-03-01 NOTE — Patient Instructions (Addendum)
We will refer you to GI to discuss colonoscopy.  Tdap today  Start atorvastatin 20mg  in addition to fenofibrate.  Return in 3-4 months for recheck labwork.   Health Maintenance, Male Adopting a healthy lifestyle and getting preventive care are important in promoting health and wellness. Ask your health care provider about: The right schedule for you to have regular tests and exams. Things you can do on your own to prevent diseases and keep yourself healthy. What should I know about diet, weight, and exercise? Eat a healthy diet  Eat a diet that includes plenty of vegetables, fruits, low-fat dairy products, and lean protein. Do not eat a lot of foods that are high in solid fats, added sugars, or sodium.  Maintain a healthy weight Body mass index (BMI) is a measurement that can be used to identify possible weight problems. It estimates body fat based on height and weight. Your health care provider can help determine your BMI and help you achieve or maintain ahealthy weight. Get regular exercise Get regular exercise. This is one of the most important things you can do for your health. Most adults should: Exercise for at least 150 minutes each week. The exercise should increase your heart rate and make you sweat (moderate-intensity exercise). Do strengthening exercises at least twice a week. This is in addition to the moderate-intensity exercise. Spend less time sitting. Even light physical activity can be beneficial. Watch cholesterol and blood lipids Have your blood tested for lipids and cholesterol at 49 years of age, then havethis test every 5 years. You may need to have your cholesterol levels checked more often if: Your lipid or cholesterol levels are high. You are older than 49 years of age. You are at high risk for heart disease. What should I know about cancer screening? Many types of cancers can be detected early and may often be prevented. Depending on your health history and  family history, you may need to have cancer screening at various ages. This may include screening for: Colorectal cancer. Prostate cancer. Skin cancer. Lung cancer. What should I know about heart disease, diabetes, and high blood pressure? Blood pressure and heart disease High blood pressure causes heart disease and increases the risk of stroke. This is more likely to develop in people who have high blood pressure readings, are of African descent, or are overweight. Talk with your health care provider about your target blood pressure readings. Have your blood pressure checked: Every 3-5 years if you are 55-44 years of age. Every year if you are 31 years old or older. If you are between the ages of 77 and 36 and are a current or former smoker, ask your health care provider if you should have a one-time screening for abdominal aortic aneurysm (AAA). Diabetes Have regular diabetes screenings. This checks your fasting blood sugar level. Have the screening done: Once every three years after age 2 if you are at a normal weight and have a low risk for diabetes. More often and at a younger age if you are overweight or have a high risk for diabetes. What should I know about preventing infection? Hepatitis B If you have a higher risk for hepatitis B, you should be screened for this virus. Talk with your health care provider to find out if you are at risk forhepatitis B infection. Hepatitis C Blood testing is recommended for: Everyone born from [birth year] through 1964. Anyone with known risk factors for hepatitis C. Sexually transmitted infections (STIs) You should be  screened each year for STIs, including gonorrhea and chlamydia, if: You are sexually active and are younger than 49 years of age. You are older than 49 years of age and your health care provider tells you that you are at risk for this type of infection. Your sexual activity has changed since you were last screened, and you are at increased  risk for chlamydia or gonorrhea. Ask your health care provider if you are at risk. Ask your health care provider about whether you are at high risk for HIV. Your health care provider may recommend a prescription medicine to help prevent HIV infection. If you choose to take medicine to prevent HIV, you should first get tested for HIV. You should then be tested every 3 months for as long as you are taking the medicine. Follow these instructions at home: Lifestyle Do not use any products that contain nicotine or tobacco, such as cigarettes, e-cigarettes, and chewing tobacco. If you need help quitting, ask your health care provider. Do not use street drugs. Do not share needles. Ask your health care provider for help if you need support or information about quitting drugs. Alcohol use Do not drink alcohol if your health care provider tells you not to drink. If you drink alcohol: Limit how much you have to 0-2 drinks a day. Be aware of how much alcohol is in your drink. In the [place] one drink equals one 12 oz bottle of beer (355 mL), one 5 oz glass of wine (148 mL), or one 1 oz glass of hard liquor (44 mL). General instructions Schedule regular health, dental, and eye exams. Stay current with your vaccines. Tell your health care provider if: You often feel depressed. You have ever been abused or do not feel safe at home. Summary Adopting a healthy lifestyle and getting preventive care are important in promoting health and wellness. Follow your health care provider's instructions about healthy diet, exercising, and getting tested or screened for diseases. Follow your health care provider's instructions on monitoring your cholesterol and blood pressure. This information is not intended to replace advice given to you by your health care provider. Make sure you discuss any questions you have with your healthcare provider. Document Revised: 08/20/2018 Document Reviewed: 08/20/2018 Elsevier Patient  Education  2022 [employer].

## 2021-03-01 NOTE — Assessment & Plan Note (Signed)
Chronic, stable. Continue current regimen. 

## 2021-03-01 NOTE — Assessment & Plan Note (Signed)
Reviewed healthy diet and lifestyle changes to affect sustainable weight loss.  

## 2021-03-01 NOTE — Assessment & Plan Note (Signed)
Preventative protocols reviewed and updated unless pt declined. Discussed healthy diet and lifestyle.  

## 2021-03-01 NOTE — Assessment & Plan Note (Signed)
Encouraged weight loss and limiting added sugars in diet.

## 2021-03-01 NOTE — Progress Notes (Signed)
Patient ID: Shawn Price, male    DOB: 1970-09-20, 50 y.o.   MRN: [id number]  This visit was conducted in person.  BP 130/80 (BP Location: Left Arm, Patient Position: Sitting, Cuff Size: Large)   Pulse 92   Temp 98 F (36.7 C) (Temporal)   Ht 5' 7.5" (1.715 m)   Wt 223 lb (101.2 kg)   SpO2 95%   BMI 34.41 kg/m    CC: CPE Subjective:   HPI: Shawn GitelmanJason M Marohl is a 50 y.o. 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 Enjoying his job with increased responsibilities.   H/o dissecting scalp cellulitis [facility] s/p accutane treatment with significant improvement. Off accutane for over a year now. Last saw derm 12/2020 (Dr Carles ColletPearlstein), placed on doxycycline and clobetasol solution. He decided to stop doxycycline due to GI upset, stable period only on steroid solution PRN.    Just started meal prepping. Restarted exercise routine with goal weight loss.    Preventative: Colon cancer screening - iFOB not completed last year. Discussed options. Will refer to [facility] for colonoscopy.  No fmhx prostate cancer  Flu shot yearly COVID vaccine - Moderna 10/2019, 11/2019, booster Moderna 12/2020 Tdap today  Seat belt use discussed Sunscreen use discussed. No changing moles. Sees derm.  Non smoker - dips  Alcohol - rare  Dentist yearly - recent crown  Eye exam yearly   Remarried 2014 Lives with wife, 2 daughters and step son, 1 dog Occupation: Database administratorpolice officer - manager Edu: bachelor degree Activity: started regular exercise routine - treadmill  Diet: good water, regular fruits/vegetables, zero soda products, 1 cup black coffee in am     Relevant past medical, surgical, family and social history reviewed and updated as indicated. Interim medical history since our last visit reviewed. Allergies and medications reviewed and  updated. Outpatient Medications Prior to Visit  Medication Sig Dispense Refill   b complex vitamins capsule Take 1 capsule by mouth daily.     clobetasol (TEMOVATE) 0.05 % external solution Apply topically 2 (two) times daily.     Omega-3 Fatty Acids (FISH OIL) 1000 MG CAPS Take 1 capsule by mouth daily in the afternoon.     allopurinol (ZYLOPRIM) 100 MG tablet Take 2 tablets (200 mg total) by mouth daily. 180 tablet 0   amLODipine (NORVASC) 2.5 MG tablet TAKE 1 TABLET BY MOUTH DAILY 90 tablet 3   fenofibrate micronized (LOFIBRA) 134 MG capsule TAKE ONE CAPSULE BY MOUTH DAILY BEFORE BREAKFAST 90 capsule 3   colchicine 0.6 MG tablet Take 1 tablet (0.6 mg total) by mouth daily as needed (gout flare). May take 2 tablets for first dose 30 tablet 3   No facility-administered medications prior to visit.     Per HPI unless specifically indicated in ROS section below Review of Systems  Constitutional:  Negative for activity change, appetite change, chills, fatigue, fever and unexpected weight change.  HENT:  Negative for hearing loss.   Eyes:  Negative for visual disturbance.  Respiratory:  Negative for cough, chest tightness, shortness of breath and wheezing.   Cardiovascular:  Negative for chest pain, palpitations and leg swelling.  Gastrointestinal:  Negative for abdominal distention, abdominal pain, blood in stool, constipation, diarrhea, nausea and vomiting.  Genitourinary:  Negative for difficulty urinating and hematuria.  Musculoskeletal:  Negative for arthralgias, myalgias and neck  pain.  Skin:  Negative for rash.  Neurological:  Negative for dizziness, seizures, syncope and headaches.  Hematological:  Negative for adenopathy. Does not bruise/bleed easily.  Psychiatric/Behavioral:  Negative for dysphoric mood. The patient is not nervous/anxious.   Objective:  BP 130/80 (BP Location: Left Arm, Patient Position: Sitting, Cuff Size: Large)   Pulse 92   Temp 98 F (36.7 C) (Temporal)    Ht 5' 7.5" (1.715 m)   Wt 223 lb (101.2 kg)   SpO2 95%   BMI 34.41 kg/m   Wt Readings from Last 3 Encounters:  03/01/21 223 lb (101.2 kg)  12/02/19 209 lb 8 oz (95 kg)  05/23/17 206 lb 4 oz (93.6 kg)      Physical Exam Vitals and nursing note reviewed.  Constitutional:      General: He is not in acute distress.    Appearance: Normal appearance. He is well-developed. He is not ill-appearing.  HENT:     Head: Normocephalic and atraumatic.     Right Ear: Hearing, tympanic membrane, ear canal and external ear normal.     Left Ear: Hearing, tympanic membrane, ear canal and external ear normal.  Eyes:     General: No scleral icterus.    Extraocular Movements: Extraocular movements intact.     Conjunctiva/sclera: Conjunctivae normal.     Pupils: Pupils are equal, round, and reactive to light.  Neck:     Thyroid: No thyroid mass or thyromegaly.  Cardiovascular:     Rate and Rhythm: Normal rate and regular rhythm.     Pulses: Normal pulses.          Radial pulses are 2+ on the right side and 2+ on the left side.     Heart sounds: Normal heart sounds. No murmur heard. Pulmonary:     Effort: Pulmonary effort is normal. No respiratory distress.     Breath sounds: Normal breath sounds. No wheezing, rhonchi or rales.  Abdominal:     General: Bowel sounds are normal. There is no distension.     Palpations: Abdomen is soft. There is no mass.     Tenderness: There is no abdominal tenderness. There is no guarding or rebound.     Hernia: No hernia is present.  Musculoskeletal:        General: Normal range of motion.     Cervical back: Normal range of motion and neck supple.     Right lower leg: No edema.     Left lower leg: No edema.  Lymphadenopathy:     Cervical: No cervical adenopathy.  Skin:    General: Skin is warm and dry.     Findings: No rash.  Neurological:     General: No focal deficit present.     Mental Status: He is alert and oriented to person, place, and time.   Psychiatric:        Mood and Affect: Mood normal.        Behavior: Behavior normal.        Thought Content: Thought content normal.        Judgment: Judgment normal.      Results for orders placed or performed in visit on 02/23/21  Microalbumin / creatinine urine ratio  Result Value Ref Range   Microalb, Ur <0.7 0.0 - 1.9 mg/dL   Creatinine,U 007.6 mg/dL   Microalb Creat Ratio 0.6 0.0 - 30.0 mg/g  Uric acid  Result Value Ref Range   Uric Acid, Serum 5.5 4.0 - 7.8 mg/dL  Hemoglobin A1c  Result Value Ref Range   Hgb A1c MFr Bld 5.8 4.6 - 6.5 %  Comprehensive metabolic panel  Result Value Ref Range   Sodium 138 135 - 145 mEq/L   Potassium 4.0 3.5 - 5.1 mEq/L   Chloride 104 96 - 112 mEq/L   CO2 26 19 - 32 mEq/L   Glucose, Bld 122 (H) 70 - 99 mg/dL   BUN 14 6 - 23 mg/dL   Creatinine, Ser 4.25 0.40 - 1.50 mg/dL   Total Bilirubin 0.4 0.2 - 1.2 mg/dL   Alkaline Phosphatase 57 39 - 117 U/L   AST 15 0 - 37 U/L   ALT 28 0 - 53 U/L   Total Protein 7.3 6.0 - 8.3 g/dL   Albumin 4.7 3.5 - 5.2 g/dL   GFR 95.63 >87.56 mL/min   Calcium 9.2 8.4 - 10.5 mg/dL  Lipid panel  Result Value Ref Range   Cholesterol 265 (H) 0 - 200 mg/dL   Triglycerides 433.2 (H) 0.0 - 149.0 mg/dL   HDL 95.18 (L) >84.16 mg/dL   VLDL 60.6 (H) 0.0 - 30.1 mg/dL   Total CHOL/HDL Ratio 7    NonHDL 226.35   Hepatitis C antibody  Result Value Ref Range   Hepatitis C Ab NON-REACTIVE NON-REACTIVE   SIGNAL TO CUT-OFF 0.00 <1.00  LDL cholesterol, direct  Result Value Ref Range   Direct LDL 177.0 mg/dL   Assessment & Plan:  This visit occurred during the SARS-CoV-2 public health emergency.  Safety protocols were in place, including screening questions prior to the visit, additional usage of staff PPE, and extensive cleaning of exam room while observing appropriate contact time as indicated for disinfecting solutions.   Problem List Items Addressed This Visit     Dyslipidemia    Chronic, trig improved off accutane  however remaining uncontrolled along with elevated LDL - rec start atorvastatin 20mg  in addition to fenofibrate. RTC 3-4 months recheck FLP, LFTs. Update sooner if any trouble tolerating statin.  The 10-year ASCVD risk score DC Denman George., et al., 2013) is: 7.5%   Values used to calculate the score:     Age: 50 years     Sex: Male     Is Non-Hispanic African American: No     Diabetic: No     Tobacco smoker: No     Systolic Blood Pressure: 130 mmHg     Is BP treated: Yes     HDL Cholesterol: 38.3 mg/dL     Total Cholesterol: 265 mg/dL        Relevant Medications   fenofibrate micronized (LOFIBRA) 134 MG capsule   atorvastatin (LIPITOR) 20 MG tablet   Other Relevant Orders   Lipid panel   Comprehensive metabolic panel   HTN (hypertension)    Chronic, stable. Continue current regimen.        Relevant Medications   amLODipine (NORVASC) 2.5 MG tablet   fenofibrate micronized (LOFIBRA) 134 MG capsule   atorvastatin (LIPITOR) 20 MG tablet   Health maintenance examination - Primary    Preventative protocols reviewed and updated unless pt declined. Discussed healthy diet and lifestyle.        Prediabetes    Encouraged weight loss and limiting added sugars in diet.        Chronic gout    Chronic, stable without flare on allopurinol 200mg  daily.        Obesity, Class I, BMI 30-34.9    Reviewed healthy diet and lifestyle changes to affect  sustainable weight loss.         Cellulitis, scalp, dissecting    Appreciate derm care.        Other Visit Diagnoses     Special screening for malignant neoplasms, colon       Relevant Orders   Ambulatory referral to Gastroenterology   Need for prophylactic vaccination with combined diphtheria-tetanus-pertussis (DTP) vaccine       Relevant Orders   Tdap vaccine greater than or equal to 7yo IM (Completed)        Meds ordered this encounter  Medications   amLODipine (NORVASC) 2.5 MG tablet    Sig: Take 1 tablet (2.5 mg total)  by mouth daily.    Dispense:  90 tablet    Refill:  3   allopurinol (ZYLOPRIM) 100 MG tablet    Sig: Take 2 tablets (200 mg total) by mouth daily.    Dispense:  180 tablet    Refill:  3   fenofibrate micronized (LOFIBRA) 134 MG capsule    Sig: Take 1 capsule (134 mg total) by mouth daily before breakfast.    Dispense:  90 capsule    Refill:  3   atorvastatin (LIPITOR) 20 MG tablet    Sig: Take 1 tablet (20 mg total) by mouth daily.    Dispense:  90 tablet    Refill:  3    Orders Placed This Encounter  Procedures   Tdap vaccine greater than or equal to 7yo IM   Lipid panel    Standing Status:   Future    Standing Expiration Date:   03/02/2022   Comprehensive metabolic panel    Standing Status:   Future    Standing Expiration Date:   03/02/2022   Ambulatory referral to Gastroenterology    Referral Priority:   Routine    Referral Type:   Consultation    Referral Reason:   Specialty Services Required    Number of Visits Requested:   1    Patient instructions: We will refer you to GI to discuss colonoscopy.  Tdap today  Start atorvastatin 20mg  daily in addition to fenofibrate.  Return in 3-4 months for recheck labwork.  Return in 1 year for next physical.   Follow up plan: Return in about 1 year (around 03/01/2022), or if symptoms worsen or fail to improve, for annual exam, prior fasting for blood work.  03/03/2022, MD

## 2021-03-01 NOTE — Assessment & Plan Note (Signed)
Chronic, trig improved off accutane however remaining uncontrolled along with elevated LDL - rec start atorvastatin 20mg  in addition to fenofibrate. RTC 3-4 months recheck FLP, LFTs. Update sooner if any trouble tolerating statin.  The 10-year ASCVD risk score DC Denman George., et al., 2013) is: 7.5%   Values used to calculate the score:     Age: 50 years     Sex: Male     Is Non-Hispanic African American: No     Diabetic: No     Tobacco smoker: No     Systolic Blood Pressure: 130 mmHg     Is BP treated: Yes     HDL Cholesterol: 38.3 mg/dL     Total Cholesterol: 265 mg/dL

## 2021-03-01 NOTE — Assessment & Plan Note (Signed)
Chronic, stable without flare on allopurinol 200mg  daily.

## 2021-03-02 ENCOUNTER — Encounter: Payer: Self-pay | Admitting: Family Medicine

## 2021-03-03 ENCOUNTER — Encounter: Payer: BC Managed Care – PPO | Admitting: Family Medicine

## 2021-03-30 ENCOUNTER — Telehealth (INDEPENDENT_AMBULATORY_CARE_PROVIDER_SITE_OTHER): Payer: Self-pay | Admitting: Gastroenterology

## 2021-03-30 DIAGNOSIS — Z1211 Encounter for screening for malignant neoplasm of colon: Secondary | ICD-10-CM

## 2021-03-30 MED ORDER — PEG 3350-KCL-NA BICARB-NACL 420 G PO SOLR: 4000.0000 mL | Freq: Once | ORAL | 0 refills | Status: AC

## 2021-03-30 NOTE — Progress Notes (Signed)
Gastroenterology Pre-Procedure Review  Request Date: 06/16/21 Requesting Physician: Dr. Maximino Greenland  PATIENT REVIEW QUESTIONS: The patient responded to the following health history questions as indicated:    1. Are you having any GI issues? no 2. Do you have a personal history of Polyps? no 3. Do you have a family history of Colon Cancer or Polyps? yes (father colon polyps) 4. Diabetes Mellitus? no 5. Joint replacements in the past 12 months?no 6. Major health problems in the past 3 months?no 7. Any artificial heart valves, MVP, or defibrillator?no    MEDICATIONS & ALLERGIES:    Patient reports the following regarding taking any anticoagulation/antiplatelet therapy:   Plavix, Coumadin, Eliquis, Xarelto, Lovenox, Pradaxa, Brilinta, or Effient? no Aspirin? no  Patient confirms/reports the following medications:  Current Outpatient Medications  Medication Sig Dispense Refill   allopurinol (ZYLOPRIM) 100 MG tablet Take 2 tablets (200 mg total) by mouth daily. 180 tablet 3   amLODipine (NORVASC) 2.5 MG tablet Take 1 tablet (2.5 mg total) by mouth daily. 90 tablet 3   atorvastatin (LIPITOR) 20 MG tablet Take 1 tablet (20 mg total) by mouth daily. 90 tablet 3   b complex vitamins capsule Take 1 capsule by mouth daily.     clobetasol (TEMOVATE) 0.05 % external solution Apply topically 2 (two) times daily.     colchicine 0.6 MG tablet Take 1 tablet (0.6 mg total) by mouth daily as needed (gout flare). May take 2 tablets for first dose 30 tablet 3   fenofibrate micronized (LOFIBRA) 134 MG capsule Take 1 capsule (134 mg total) by mouth daily before breakfast. 90 capsule 3   Omega-3 Fatty Acids (FISH OIL) 1000 MG CAPS Take 1 capsule by mouth daily in the afternoon.     No current facility-administered medications for this visit.    Patient confirms/reports the following allergies:  Allergies  Allergen Reactions   Crestor [Rosuvastatin] Other (See Comments)    myalgias    No orders of the  defined types were placed in this encounter.   AUTHORIZATION INFORMATION Primary Insurance: 1D#: Group #:  Secondary Insurance: 1D#: Group #:  SCHEDULE INFORMATION: Date: 06/16/21 Time: Location: [facility]

## 2021-06-15 ENCOUNTER — Encounter: Payer: Self-pay | Admitting: Gastroenterology

## 2021-06-16 ENCOUNTER — Encounter: Payer: Self-pay | Admitting: Gastroenterology

## 2021-06-16 ENCOUNTER — Encounter
Admission: RE | Disposition: A | Discharge status: Home / Self Care | Payer: Self-pay | Source: Home / Self Care | Attending: Gastroenterology

## 2021-06-16 ENCOUNTER — Ambulatory Visit: Payer: BC Managed Care – PPO | Admitting: Anesthesiology

## 2021-06-16 ENCOUNTER — Other Ambulatory Visit: Payer: Self-pay

## 2021-06-16 ENCOUNTER — Ambulatory Visit
Admission: RE | Admit: 2021-06-16 | Discharge: 2021-06-16 | Disposition: A | Discharge status: Home / Self Care | Payer: BC Managed Care – PPO | Attending: Gastroenterology | Admitting: Gastroenterology

## 2021-06-16 DIAGNOSIS — I10 Essential (primary) hypertension: Secondary | ICD-10-CM | POA: Insufficient documentation

## 2021-06-16 DIAGNOSIS — K219 Gastro-esophageal reflux disease without esophagitis: Secondary | ICD-10-CM | POA: Diagnosis not present

## 2021-06-16 DIAGNOSIS — E781 Pure hyperglyceridemia: Secondary | ICD-10-CM | POA: Diagnosis not present

## 2021-06-16 DIAGNOSIS — E785 Hyperlipidemia, unspecified: Secondary | ICD-10-CM | POA: Diagnosis not present

## 2021-06-16 DIAGNOSIS — Z1211 Encounter for screening for malignant neoplasm of colon: Secondary | ICD-10-CM | POA: Diagnosis not present

## 2021-06-16 DIAGNOSIS — Z79899 Other long term (current) drug therapy: Secondary | ICD-10-CM | POA: Insufficient documentation

## 2021-06-16 DIAGNOSIS — K648 Other hemorrhoids: Secondary | ICD-10-CM | POA: Insufficient documentation

## 2021-06-16 DIAGNOSIS — Z888 Allergy status to other drugs, medicaments and biological substances status: Secondary | ICD-10-CM | POA: Diagnosis not present

## 2021-06-16 DIAGNOSIS — K649 Unspecified hemorrhoids: Secondary | ICD-10-CM | POA: Diagnosis not present

## 2021-06-16 HISTORY — PX: COLONOSCOPY WITH PROPOFOL: SHX5780

## 2021-06-16 HISTORY — DX: Gastro-esophageal reflux disease without esophagitis: K21.9

## 2021-06-16 SURGERY — COLONOSCOPY WITH PROPOFOL
Anesthesia: General

## 2021-06-16 MED ORDER — PROPOFOL 10 MG/ML IV BOLUS
INTRAVENOUS | Status: DC | PRN
  Administered 2021-06-16: 70 mg via INTRAVENOUS

## 2021-06-16 MED ORDER — PROPOFOL 500 MG/50ML IV EMUL
INTRAVENOUS | Status: DC | PRN
  Administered 2021-06-16: 140 ug/kg/min via INTRAVENOUS

## 2021-06-16 MED ORDER — SODIUM CHLORIDE 0.9 % IV SOLN: INTRAVENOUS | Status: DC

## 2021-06-16 NOTE — H&P (Signed)
Melodie Bouillon, MD [address], [address], [place], [place], [address] [address], [place], [place], [address] Phone: [phone]  Fax: [phone]  Primary Care Physician:  Eustaquio Boyden, MD   Pre-Procedure History & Physical: HPI:  Shawn Price is a 50 y.o. male is here for a colonoscopy.   Past Medical History:  Diagnosis Date   Dyslipidemia    predominantly hypertriglyceridemia   GERD (gastroesophageal reflux disease)    Gout    Heartburn    controlled with tums   History of kidney stones 09/10/1988   HTN (hypertension)     Past Surgical History:  Procedure Laterality Date   CYSTECTOMY Right ~1998   ganglion cyst on wrist    Prior to Admission medications   Medication Sig Start Date End Date Taking? Authorizing Provider  allopurinol (ZYLOPRIM) 100 MG tablet Take 2 tablets (200 mg total) by mouth daily. 03/01/21  Yes Eustaquio Boyden, MD  amLODipine (NORVASC) 2.5 MG tablet Take 1 tablet (2.5 mg total) by mouth daily. 03/01/21  Yes Eustaquio Boyden, MD  atorvastatin (LIPITOR) 20 MG tablet Take 1 tablet (20 mg total) by mouth daily. 03/01/21  Yes Eustaquio Boyden, MD  b complex vitamins capsule Take 1 capsule by mouth daily.   Yes [provider]  fenofibrate micronized (LOFIBRA) 134 MG capsule Take 1 capsule (134 mg total) by mouth daily before breakfast. 03/01/21  Yes Eustaquio Boyden, MD  Omega-3 Fatty Acids (FISH OIL) 1000 MG CAPS Take 1 capsule by mouth daily in the afternoon.   Yes [provider]  clobetasol (TEMOVATE) 0.05 % external solution Apply topically 2 (two) times daily. 01/04/21   [provider]  colchicine 0.6 MG tablet Take 1 tablet (0.6 mg total) by mouth daily as needed (gout flare). May take 2 tablets for first dose 05/23/17 08/03/20  Eustaquio Boyden, MD    Allergies as of 03/30/2021 - Review Complete 03/01/2021  Allergen Reaction Noted   Crestor [rosuvastatin] Other (See Comments) 09/08/2013     Family History  Problem Relation Age of Onset   Hypertension Mother    Hyperlipidemia Mother    CAD Father        alcohol use   Stroke Paternal Grandmother 61   Cancer Maternal Grandfather 62       lung, smoker   COPD Maternal Grandmother    Heart failure Maternal Grandmother 39   Diabetes Neg Hx     Social History   Socioeconomic History   Marital status: Married    Spouse name: Not on file   Number of children: Not on file   Years of education: Not on file   Highest education level: Not on file  Occupational History   Not on file  Tobacco Use   Smoking status: Never   Smokeless tobacco: Current    Types: Snuff   Tobacco comments:    1 can/day  Vaping Use   Vaping Use: Never used  Substance and Sexual Activity   Alcohol use: Yes    Comment: rare- maybe once a month   Drug use: No   Sexual activity: Yes    Partners: Female    Birth control/protection: Injection  Other Topics Concern   Not on file  Social History Narrative   Remarried this year (2014)   Lives with 2nd wife, 2 daughters and step son, 1 dog   Occupation: Emergency planning/management officer, [employer]   Edu: bachelor degree   Activity: runs a few times a week  Diet: good water, some fruits/vegetables   Social Determinants of Health   Financial Resource Strain: Not on file  Food Insecurity: Not on file  Transportation Needs: Not on file  Physical Activity: Not on file  Stress: Not on file  Social Connections: Not on file  Intimate Partner Violence: Not on file    Review of Systems: See HPI, otherwise negative ROS  Physical Exam: Constitutional: General:   Alert,  Well-developed, well-nourished, pleasant and cooperative in NAD BP 133/88   Pulse 84   Temp (!) 96.7 F (35.9 C) (Temporal)   Resp 20   Ht 5\' 8"  (1.727 m)   Wt 97.5 kg   SpO2 98%   BMI 32.69 kg/m   Head: Normocephalic, atraumatic.   Eyes:  Sclera clear, no icterus.   Conjunctiva pink.   Mouth:  No deformity or lesions,  oropharynx pink & moist.  Neck:  Supple, trachea midline  Respiratory: Normal respiratory effort  Gastrointestinal:  Soft, non-tender and non-distended without masses, hepatosplenomegaly or hernias noted.  No guarding or rebound tenderness.     Cardiac: No clubbing or edema.  No cyanosis. Normal posterior tibial pedal pulses noted.  Lymphatic:  No significant cervical adenopathy.  Psych:  Alert and cooperative. Normal mood and affect.  Musculoskeletal:   Symmetrical without gross deformities. 5/5 Lower extremity strength bilaterally.  Skin: Warm. Intact without significant lesions or rashes. No jaundice.  Neurologic:  Face symmetrical, tongue midline, Normal sensation to touch;  grossly normal neurologically.  Psych:  Alert and oriented x3, Alert and cooperative. Normal mood and affect.  Impression/Plan: Shawn Price is here for a colonoscopy to be performed for average risk screening.  Risks, benefits, limitations, and alternatives regarding  colonoscopy have been reviewed with the patient.  Questions have been answered.  All parties agreeable.   Allayne Gitelman, MD  06/16/2021, 7:59 AM

## 2021-06-16 NOTE — Op Note (Addendum)
[facility] Patient Name: Shawn Price Procedure Date: 06/16/2021 8:04 AM MRN: [id number] Account #: [id number] Date of Birth: April 16, 1971 Admit Type: Outpatient Age: 50 Room: [facility] ENDO ROOM 2 Gender: Male Note Status: Finalized Instrument Name: Prentice Docker [id number] Procedure:             Colonoscopy Indications:           Screening for colorectal malignant neoplasm Providers:             Jalik Gellatly B. Maximino Greenland MD, MD Referring MD:          Eustaquio Boyden (Referring MD) Medicines:             Monitored Anesthesia Care Complications:         No immediate complications. Procedure:             Pre-Anesthesia Assessment:                        - Prior to the procedure, a History and Physical was                         performed, and patient medications, allergies and                         sensitivities were reviewed. The patient's tolerance                         of previous anesthesia was reviewed.                        - The risks and benefits of the procedure and the                         sedation options and risks were discussed with the                         patient. All questions were answered and informed                         consent was obtained.                        - Patient identification and proposed procedure were                         verified prior to the procedure by the physician, the                         nurse, the anesthetist and the technician. The                         procedure was verified in the pre-procedure area in                         the procedure room in the endoscopy suite.                        - ASA Grade Assessment: II - A patient with mild  systemic disease.                        - After reviewing the risks and benefits, the patient                         was deemed in satisfactory condition to undergo the                         procedure.                        After  obtaining informed consent, the colonoscope was                         passed under direct vision. Throughout the procedure,                         the patient's blood pressure, pulse, and oxygen                         saturations were monitored continuously. The                         Colonoscope was introduced through the anus and                         advanced to the the cecum, identified by appendiceal                         orifice and ileocecal valve. The colonoscopy was                         performed with ease. The patient tolerated the                         procedure well. The quality of the bowel preparation                         was good. Findings:      The perianal and digital rectal examinations were normal.      The rectum, sigmoid colon, descending colon, transverse colon, ascending       colon and cecum appeared normal.      Non-bleeding internal hemorrhoids were found during retroflexion.      Anal papilla(e) were hypertrophied.      No additional abnormalities were found on retroflexion. Impression:            - The rectum, sigmoid colon, descending colon,                         transverse colon, ascending colon and cecum are normal.                        - Non-bleeding internal hemorrhoids.                        - Anal papilla(e) were hypertrophied.                        - No specimens  collected. Recommendation:        - Discharge patient to home.                        - High fiber diet.                        - Resume previous diet.                        - Continue present medications.                        - Repeat colonoscopy in 10 years for screening                         purposes.                        - Return to primary care physician as previously                         scheduled.                        - The findings and recommendations were discussed with                         the patient.                        - The findings and  recommendations were discussed with                         the patient's family.                        - In the future, if patient develops new symptoms such                         as blood per rectum, abdominal pain, weight loss,                         altered bowel habits or any other reason for concern,                         patient should discuss this with thier PCP as they may                         need a GI referral at that time or evaluation for need                         for colonoscopy earlier than the recommended screening                         colonoscopy.                        In addition, if patient's family history of colon  cancer changes (no family history at this time) in the                         future, earlier screening may be indicated and patient                         should discuss this with PCP as well. Procedure Code(s):     --- Professional ---                        [phone], Colonoscopy, flexible; diagnostic, including                         collection of specimen(s) by brushing or washing, when                         performed (separate procedure) Diagnosis Code(s):     --- Professional ---                        Z12.11, Encounter for screening for malignant neoplasm                         of colon CPT copyright 2019 American Medical Association. All rights reserved. The codes documented in this report are preliminary and upon coder review may  be revised to meet current compliance requirements.  Melodie Bouillon, MD Michel Bickers B. Maximino Greenland MD, MD 06/16/2021 8:48:40 AM This report has been signed electronically. Number of Addenda: 0 Note Initiated On: 06/16/2021 8:04 AM Scope Withdrawal Time: 0 hours 17 minutes 14 seconds  Total Procedure Duration: 0 hours 22 minutes 4 seconds       [facility]

## 2021-06-16 NOTE — Anesthesia Postprocedure Evaluation (Signed)
Anesthesia Post Note  Patient: Shawn Price  Procedure(s) Performed: COLONOSCOPY WITH PROPOFOL  Patient location during evaluation: PACU Anesthesia Type: General Level of consciousness: awake and alert, oriented and patient cooperative Pain management: pain level controlled Vital Signs Assessment: post-procedure vital signs reviewed and stable Respiratory status: spontaneous breathing, nonlabored ventilation and respiratory function stable Cardiovascular status: blood pressure returned to baseline and stable Postop Assessment: adequate PO intake Anesthetic complications: no   No notable events documented.   Last Vitals:  Vitals:   06/16/21 0849 06/16/21 0900  BP: 124/81 127/87  Pulse: 76 69  Resp: 16 18  Temp:    SpO2: 100% 100%    Last Pain:  Vitals:   06/16/21 0900  TempSrc:   PainSc: 0-No pain                 Reed Breech

## 2021-06-16 NOTE — Anesthesia Preprocedure Evaluation (Addendum)
Anesthesia Evaluation  Patient identified by MRN, date of birth, ID band Patient awake    Reviewed: Allergy & Precautions, NPO status , Patient's Chart, lab work & pertinent test results  History of Anesthesia Complications Negative for: history of anesthetic complications  Airway Mallampati: IV   Neck ROM: Full    Dental no notable dental hx.    Pulmonary neg pulmonary ROS,    Pulmonary exam normal breath sounds clear to auscultation       Cardiovascular hypertension, Normal cardiovascular exam Rhythm:Regular Rate:Normal     Neuro/Psych negative neurological ROS     GI/Hepatic GERD  ,  Endo/Other  negative endocrine ROS  Renal/GU Renal disease (nephrolithiasis)     Musculoskeletal Gout    Abdominal   Peds  Hematology negative hematology ROS (+)   Anesthesia Other Findings   Reproductive/Obstetrics                            Anesthesia Physical Anesthesia Plan  ASA: 2  Anesthesia Plan: General   Post-op Pain Management:    Induction: Intravenous  PONV Risk Score and Plan: 2 and Propofol infusion, TIVA and Treatment may vary due to age or medical condition  Airway Management Planned: Natural Airway  Additional Equipment:   Intra-op Plan:   Post-operative Plan:   Informed Consent: I have reviewed the patients History and Physical, chart, labs and discussed the procedure including the risks, benefits and alternatives for the proposed anesthesia with the patient or authorized representative who has indicated his/her understanding and acceptance.       Plan Discussed with: CRNA  Anesthesia Plan Comments:        Anesthesia Quick Evaluation

## 2021-06-16 NOTE — Transfer of Care (Signed)
Immediate Anesthesia Transfer of Care Note  Patient: Shawn Price  Procedure(s) Performed: COLONOSCOPY WITH PROPOFOL  Patient Location: PACU  Anesthesia Type:General  Level of Consciousness: awake, alert  and oriented  Airway & Oxygen Therapy: Patient Spontanous Breathing  Post-op Assessment: Report given to RN and Post -op Vital signs reviewed and stable  Post vital signs: Reviewed and stable  Last Vitals:  Vitals Value Taken Time  BP    Temp    Pulse 84 06/16/21 0838  Resp 11 06/16/21 0838  SpO2 93 % 06/16/21 0838    Last Pain:  Vitals:   06/16/21 0752  TempSrc: Temporal  PainSc: 0-No pain         Complications: No notable events documented.

## 2021-06-19 ENCOUNTER — Encounter: Payer: Self-pay | Admitting: Gastroenterology

## 2021-11-27 ENCOUNTER — Other Ambulatory Visit: Payer: Self-pay

## 2021-11-27 MED ORDER — FENOFIBRATE 134 MG PO CAPS: 134.0000 mg | ORAL_CAPSULE | Freq: Every day | ORAL | 0 refills | Status: DC

## 2021-12-19 DIAGNOSIS — K047 Periapical abscess without sinus: Secondary | ICD-10-CM | POA: Diagnosis not present

## 2021-12-20 DIAGNOSIS — K047 Periapical abscess without sinus: Secondary | ICD-10-CM | POA: Diagnosis not present

## 2022-02-26 ENCOUNTER — Other Ambulatory Visit: Payer: Self-pay | Admitting: Family Medicine

## 2022-03-24 ENCOUNTER — Other Ambulatory Visit: Payer: Self-pay | Admitting: Family Medicine

## 2022-03-26 ENCOUNTER — Other Ambulatory Visit: Payer: Self-pay | Admitting: Family Medicine

## 2022-03-26 NOTE — Telephone Encounter (Signed)
Noted  

## 2022-03-26 NOTE — Telephone Encounter (Signed)
Pt scheduled for 8.4.23 at 12:30 for physical and 7.28.23 for cpe labs

## 2022-03-26 NOTE — Telephone Encounter (Signed)
LVM for patient to schedule.

## 2022-03-26 NOTE — Telephone Encounter (Signed)
E-scribed refill.  Plz schedule CPE and lab visits for additional refills.  

## 2022-04-05 ENCOUNTER — Other Ambulatory Visit: Payer: Self-pay | Admitting: Family Medicine

## 2022-04-05 DIAGNOSIS — I10 Essential (primary) hypertension: Secondary | ICD-10-CM

## 2022-04-05 DIAGNOSIS — R7303 Prediabetes: Secondary | ICD-10-CM

## 2022-04-05 DIAGNOSIS — Z125 Encounter for screening for malignant neoplasm of prostate: Secondary | ICD-10-CM

## 2022-04-05 DIAGNOSIS — E785 Hyperlipidemia, unspecified: Secondary | ICD-10-CM

## 2022-04-05 DIAGNOSIS — M1A09X0 Idiopathic chronic gout, multiple sites, without tophus (tophi): Secondary | ICD-10-CM

## 2022-04-06 ENCOUNTER — Other Ambulatory Visit (INDEPENDENT_AMBULATORY_CARE_PROVIDER_SITE_OTHER): Payer: BC Managed Care – PPO

## 2022-04-06 DIAGNOSIS — E785 Hyperlipidemia, unspecified: Secondary | ICD-10-CM | POA: Diagnosis not present

## 2022-04-06 DIAGNOSIS — R7303 Prediabetes: Secondary | ICD-10-CM | POA: Diagnosis not present

## 2022-04-06 DIAGNOSIS — I10 Essential (primary) hypertension: Secondary | ICD-10-CM

## 2022-04-06 DIAGNOSIS — M1A09X0 Idiopathic chronic gout, multiple sites, without tophus (tophi): Secondary | ICD-10-CM | POA: Diagnosis not present

## 2022-04-06 DIAGNOSIS — Z125 Encounter for screening for malignant neoplasm of prostate: Secondary | ICD-10-CM

## 2022-04-06 LAB — LIPID PANEL
Cholesterol: 177 mg/dL (ref 0–200)
HDL: 36.7 mg/dL — ABNORMAL LOW (ref >39.00)
NonHDL: 139.99
Total CHOL/HDL Ratio: 5
Triglycerides: 350 mg/dL — ABNORMAL HIGH (ref 0.0–149.0)
VLDL: 70 mg/dL — ABNORMAL HIGH (ref 0.0–40.0)

## 2022-04-06 LAB — COMPREHENSIVE METABOLIC PANEL
ALT: 38 U/L (ref 0–53)
AST: 22 U/L (ref 0–37)
Albumin: 4.6 g/dL (ref 3.5–5.2)
Alkaline Phosphatase: 68 U/L (ref 39–117)
BUN: 13 mg/dL (ref 6–23)
CO2: 26 mEq/L (ref 19–32)
Calcium: 9.4 mg/dL (ref 8.4–10.5)
Chloride: 106 mEq/L (ref 96–112)
Creatinine, Ser: 1.07 mg/dL (ref 0.40–1.50)
GFR: 81.11 mL/min (ref >60.00)
Glucose, Bld: 142 mg/dL — ABNORMAL HIGH (ref 70–99)
Potassium: 4.1 mEq/L (ref 3.5–5.1)
Sodium: 141 mEq/L (ref 135–145)
Total Bilirubin: 0.3 mg/dL (ref 0.2–1.2)
Total Protein: 6.8 g/dL (ref 6.0–8.3)

## 2022-04-06 LAB — MICROALBUMIN / CREATININE URINE RATIO
Creatinine,U: 184.6 mg/dL
Microalb Creat Ratio: 0.4 mg/g (ref 0.0–30.0)
Microalb, Ur: 0.7 mg/dL (ref 0.0–1.9)

## 2022-04-06 LAB — PSA: PSA: 0.5 ng/mL (ref 0.10–4.00)

## 2022-04-06 LAB — CBC WITH DIFFERENTIAL/PLATELET
Basophils Absolute: 0 10*3/uL (ref 0.0–0.1)
Basophils Relative: 0.6 % (ref 0.0–3.0)
Eosinophils Absolute: 0.2 10*3/uL (ref 0.0–0.7)
Eosinophils Relative: 2.9 % (ref 0.0–5.0)
HCT: 38.9 % — ABNORMAL LOW (ref 39.0–52.0)
Hemoglobin: 13.5 g/dL (ref 13.0–17.0)
Lymphocytes Relative: 43.8 % (ref 12.0–46.0)
Lymphs Abs: 2.3 10*3/uL (ref 0.7–4.0)
MCHC: 34.6 g/dL (ref 30.0–36.0)
MCV: 90.6 fl (ref 78.0–100.0)
Monocytes Absolute: 0.4 10*3/uL (ref 0.1–1.0)
Monocytes Relative: 7.4 % (ref 3.0–12.0)
Neutro Abs: 2.4 10*3/uL (ref 1.4–7.7)
Neutrophils Relative %: 45.3 % (ref 43.0–77.0)
Platelets: 255 10*3/uL (ref 150.0–400.0)
RBC: 4.29 Mil/uL (ref 4.22–5.81)
RDW: 13.1 % (ref 11.5–15.5)
WBC: 5.2 10*3/uL (ref 4.0–10.5)

## 2022-04-06 LAB — URIC ACID: Uric Acid, Serum: 4.7 mg/dL (ref 4.0–7.8)

## 2022-04-06 LAB — LDL CHOLESTEROL, DIRECT: Direct LDL: 98 mg/dL

## 2022-04-06 LAB — HEMOGLOBIN A1C: Hgb A1c MFr Bld: 6.8 % — ABNORMAL HIGH (ref 4.6–6.5)

## 2022-04-10 ENCOUNTER — Other Ambulatory Visit: Payer: Self-pay | Admitting: Family Medicine

## 2022-04-13 ENCOUNTER — Encounter: Payer: Self-pay | Admitting: Family Medicine

## 2022-04-13 ENCOUNTER — Ambulatory Visit (INDEPENDENT_AMBULATORY_CARE_PROVIDER_SITE_OTHER): Payer: BC Managed Care – PPO | Admitting: Family Medicine

## 2022-04-13 VITALS — BP 138/78 | HR 94 | Temp 97.5°F | Ht 68.0 in | Wt 227.2 lb

## 2022-04-13 DIAGNOSIS — Z0000 Encounter for general adult medical examination without abnormal findings: Secondary | ICD-10-CM | POA: Diagnosis not present

## 2022-04-13 DIAGNOSIS — E119 Type 2 diabetes mellitus without complications: Secondary | ICD-10-CM

## 2022-04-13 DIAGNOSIS — E785 Hyperlipidemia, unspecified: Secondary | ICD-10-CM | POA: Diagnosis not present

## 2022-04-13 DIAGNOSIS — M1A09X0 Idiopathic chronic gout, multiple sites, without tophus (tophi): Secondary | ICD-10-CM

## 2022-04-13 DIAGNOSIS — E669 Obesity, unspecified: Secondary | ICD-10-CM

## 2022-04-13 DIAGNOSIS — I10 Essential (primary) hypertension: Secondary | ICD-10-CM | POA: Diagnosis not present

## 2022-04-13 DIAGNOSIS — L819 Disorder of pigmentation, unspecified: Secondary | ICD-10-CM

## 2022-04-13 MED ORDER — FENOFIBRATE 134 MG PO CAPS: ORAL_CAPSULE | ORAL | 3 refills | Status: DC

## 2022-04-13 MED ORDER — AMLODIPINE BESYLATE 2.5 MG PO TABS: ORAL_TABLET | ORAL | 3 refills | Status: DC

## 2022-04-13 MED ORDER — ALLOPURINOL 100 MG PO TABS: ORAL_TABLET | ORAL | 3 refills | Status: DC

## 2022-04-13 MED ORDER — ATORVASTATIN CALCIUM 20 MG PO TABS: ORAL_TABLET | ORAL | 3 refills | Status: DC

## 2022-04-13 NOTE — Assessment & Plan Note (Signed)
Reviewed A1c trend into diabetes range, albeit controlled at this time. Encouraged low sugar low carb diabetic diet, nutrition handout provided, recommend return in 6 months for DM f/u visit.  Pt agrees with plan.

## 2022-04-13 NOTE — Assessment & Plan Note (Addendum)
Chronic, stable. Continue current regimen of low dose amlodipine.

## 2022-04-13 NOTE — Assessment & Plan Note (Signed)
Weight gain noted.  Encouraged healthy diet and lifestyle choices to affect sustainable weight loss, especially in setting of new diabetes diagnosis.

## 2022-04-13 NOTE — Assessment & Plan Note (Signed)
Chronic, overall improved TC and LDL on atorvastatin 20mg  daily started last year but triglycerides stay high (partly driven by hyperglycemia) and HDL remains low - continue statin with fenofibrate. Reassess with weight loss and better sugar control. The 10-year ASCVD risk score (Arnett DK, et al., 2019) is: 5.4%   Values used to calculate the score:     Age: 51 years     Sex: Male     Is Non-Hispanic African American: No     Diabetic: No     Tobacco smoker: No     Systolic Blood Pressure: 138 mmHg     Is BP treated: Yes     HDL Cholesterol: 36.7 mg/dL     Total Cholesterol: 177 mg/dL

## 2022-04-13 NOTE — Assessment & Plan Note (Signed)
Preventative protocols reviewed and updated unless pt declined. Discussed healthy diet and lifestyle.  

## 2022-04-13 NOTE — Assessment & Plan Note (Addendum)
Question tinea versicolor however patient states dermatology said it was not fungal.  No dermatology records available.  We will continue to monitor.

## 2022-04-13 NOTE — Patient Instructions (Addendum)
Consider shingrix shot series.  Good to see you today, return as needed or in 6 months for diabetes follow up.  Work on low sugar low carb diabetic diet in interim.  Diabetes Mellitus and Nutrition, Adult When you have diabetes, or diabetes mellitus, it is very important to have healthy eating habits because your blood sugar (glucose) levels are greatly affected by what you eat and drink. Eating healthy foods in the right amounts, at about the same times every day, can help you: Manage your blood glucose. Lower your risk of heart disease. Improve your blood pressure. Reach or maintain a healthy weight. What can affect my meal plan? Every person with diabetes is different, and each person has different needs for a meal plan. Your health care provider may recommend that you work with a dietitian to make a meal plan that is best for you. Your meal plan may vary depending on factors such as: The calories you need. The medicines you take. Your weight. Your blood glucose, blood pressure, and cholesterol levels. Your activity level. Other health conditions you have, such as heart or kidney disease. How do carbohydrates affect me? Carbohydrates, also called carbs, affect your blood glucose level more than any other type of food. Eating carbs raises the amount of glucose in your blood. It is important to know how many carbs you can safely have in each meal. This is different for every person. Your dietitian can help you calculate how many carbs you should have at each meal and for each snack. How does alcohol affect me? Alcohol can cause a decrease in blood glucose (hypoglycemia), especially if you use insulin or take certain diabetes medicines by mouth. Hypoglycemia can be a life-threatening condition. Symptoms of hypoglycemia, such as sleepiness, dizziness, and confusion, are similar to symptoms of having too much alcohol. Do not drink alcohol if: Your health care provider tells you not to  drink. You are pregnant, may be pregnant, or are planning to become pregnant. If you drink alcohol: Limit how much you have to: 0-1 drink a day for women. 0-2 drinks a day for men. Know how much alcohol is in your drink. In the [place] one drink equals one 12 oz bottle of beer (355 mL), one 5 oz glass of wine (148 mL), or one 1 oz glass of hard liquor (44 mL). Keep yourself hydrated with water, diet soda, or unsweetened iced tea. Keep in mind that regular soda, juice, and other mixers may contain a lot of sugar and must be counted as carbs. What are tips for following this plan?  Reading food labels Start by checking the serving size on the Nutrition Facts label of packaged foods and drinks. The number of calories and the amount of carbs, fats, and other nutrients listed on the label are based on one serving of the item. Many items contain more than one serving per package. Check the total grams (g) of carbs in one serving. Check the number of grams of saturated fats and trans fats in one serving. Choose foods that have a low amount or none of these fats. Check the number of milligrams (mg) of salt (sodium) in one serving. Most people should limit total sodium intake to less than 2,300 mg per day. Always check the nutrition information of foods labeled as "low-fat" or "nonfat." These foods may be higher in added sugar or refined carbs and should be avoided. Talk to your dietitian to identify your daily goals for nutrients listed on the label.  Shopping Avoid buying canned, pre-made, or processed foods. These foods tend to be high in fat, sodium, and added sugar. Shop around the outside edge of the grocery store. This is where you will most often find fresh fruits and vegetables, bulk grains, fresh meats, and fresh dairy products. Cooking Use low-heat cooking methods, such as baking, instead of high-heat cooking methods, such as deep frying. Cook using healthy oils, such as olive, canola, or  sunflower oil. Avoid cooking with butter, cream, or high-fat meats. Meal planning Eat meals and snacks regularly, preferably at the same times every day. Avoid going long periods of time without eating. Eat foods that are high in fiber, such as fresh fruits, vegetables, beans, and whole grains. Eat 4-6 oz (112-168 g) of lean protein each day, such as lean meat, chicken, fish, eggs, or tofu. One ounce (oz) (28 g) of lean protein is equal to: 1 oz (28 g) of meat, chicken, or fish. 1 egg.  cup (62 g) of tofu. Eat some foods each day that contain healthy fats, such as avocado, nuts, seeds, and fish. What foods should I eat? Fruits Berries. Apples. Oranges. Peaches. Apricots. Plums. Grapes. Mangoes. Papayas. Pomegranates. Kiwi. Cherries. Vegetables Leafy greens, including lettuce, spinach, kale, chard, collard greens, mustard greens, and cabbage. Beets. Cauliflower. Broccoli. Carrots. Green beans. Tomatoes. Peppers. Onions. Cucumbers. Brussels sprouts. Grains Whole grains, such as whole-wheat or whole-grain bread, crackers, tortillas, cereal, and pasta. Unsweetened oatmeal. Quinoa. Brown or wild rice. Meats and other proteins Seafood. Poultry without skin. Lean cuts of poultry and beef. Tofu. Nuts. Seeds. Dairy Low-fat or fat-free dairy products such as milk, yogurt, and cheese. The items listed above may not be a complete list of foods and beverages you can eat and drink. Contact a dietitian for more information. What foods should I avoid? Fruits Fruits canned with syrup. Vegetables Canned vegetables. Frozen vegetables with butter or cream sauce. Grains Refined white flour and flour products such as bread, pasta, snack foods, and cereals. Avoid all processed foods. Meats and other proteins Fatty cuts of meat. Poultry with skin. Breaded or fried meats. Processed meat. Avoid saturated fats. Dairy Full-fat yogurt, cheese, or milk. Beverages Sweetened drinks, such as soda or iced tea. The  items listed above may not be a complete list of foods and beverages you should avoid. Contact a dietitian for more information. Questions to ask a health care provider Do I need to meet with a certified diabetes care and education specialist? Do I need to meet with a dietitian? What number can I call if I have questions? When are the best times to check my blood glucose? Where to find more information: American Diabetes Association: diabetes.org Academy of Nutrition and Dietetics: eatright.[employer] of Diabetes and Digestive and Kidney Diseases: [employer]is Association of Diabetes Care & Education Specialists: diabeteseducator.org Summary It is important to have healthy eating habits because your blood sugar (glucose) levels are greatly affected by what you eat and drink. It is important to use alcohol carefully. A healthy meal plan will help you manage your blood glucose and lower your risk of heart disease. Your health care provider may recommend that you work with a dietitian to make a meal plan that is best for you. This information is not intended to replace advice given to you by your health care provider. Make sure you discuss any questions you have with your health care provider. Document Revised: 03/30/2020 Document Reviewed: 03/30/2020 Elsevier Patient Education  [place].

## 2022-04-13 NOTE — Assessment & Plan Note (Signed)
Chronic, stable period on allopurinol 200 mg daily without recent flare.  Patient desires to continue at this dose, consider taper next year if remains stable.

## 2022-04-13 NOTE — Progress Notes (Signed)
Patient ID: Shawn Price, male    DOB: [birth year], 51 y.o.   MRN: [id number]  This visit was conducted in person.  BP 138/78   Pulse 94   Temp (!) 97.5 F (36.4 C) (Temporal)   Ht 5\' 8"  (1.727 m)   Wt 227 lb 4 oz (103.1 kg)   SpO2 97%   BMI 34.55 kg/m    CC: CPE Subjective:   HPI: Shawn Price is a 51 y.o. 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   H/o dissecting scalp cellulitis - manages with clobetasol solution.    Preventative: COLONOSCOPY WITH PROPOFOL 06/16/2021 - WNL, int hem (Tahiliani, Varnita B, MD) Prostate cancer - no fmhx prostate cancer. Nocturia x1, no weakening stream  Lung cancer screen - not eligible  Flu shot yearly COVID vaccine - Moderna 10/2019, 11/2019, booster 12/2020 Tdap 02/2021 Shingrix - discussed  Seat belt use discussed  Sunscreen use discussed. No changing moles. has not seen derm recently.  Non smoker - dips  Alcohol - social  Dentist yearly - recent crown 2022 then this year tooth extraction complicated by infection - saw 2023 oral surgery - pending rpt crowns Eye exam yearly   Remarried 2014 Lives with wife, 2 daughters and step son, 1 dog Occupation: 2015 Edu: bachelor degree Activity: started regular exercise routine - treadmill  Diet: good water, regular fruits/vegetables, zero soda products, 1 cup black coffee in am     Relevant past medical, surgical, family and social history reviewed and updated as indicated. Interim medical history since our last visit reviewed. Allergies and medications reviewed and updated. Outpatient Medications Prior to Visit  Medication Sig Dispense Refill   clobetasol (TEMOVATE) 0.05 % external solution Apply topically 2 (two) times daily.     colchicine 0.6 MG tablet Take 1 tablet (0.6 mg total) by mouth daily as needed (gout  flare). May take 2 tablets for first dose 30 tablet 3   Multiple Vitamins-Minerals (MULTIVITAMIN MEN 50+ PO) Take by mouth daily.     Omega-3 Fatty Acids (FISH OIL) 1000 MG CAPS Take 1 capsule by mouth daily in the afternoon.     allopurinol (ZYLOPRIM) 100 MG tablet TAKE 2 TABLETS(200 MG) BY MOUTH DAILY 180 tablet 0   amLODipine (NORVASC) 2.5 MG tablet TAKE 1 TABLET(2.5 MG) BY MOUTH DAILY 90 tablet 0   atorvastatin (LIPITOR) 20 MG tablet TAKE 1 TABLET(20 MG) BY MOUTH DAILY 90 tablet 0   fenofibrate micronized (LOFIBRA) 134 MG capsule TAKE 1 CAPSULE(134 MG) BY MOUTH DAILY BEFORE BREAKFAST 90 capsule 0   b complex vitamins capsule Take 1 capsule by mouth daily.     No facility-administered medications prior to visit.     Per HPI unless specifically indicated in ROS section below Review of Systems  Constitutional:  Negative for activity change, appetite change, chills, fatigue, fever and unexpected weight change.  HENT:  Negative for hearing loss.   Eyes:  Negative for visual disturbance.  Respiratory:  Negative for cough, chest tightness, shortness of breath and wheezing.   Cardiovascular:  Negative for chest pain, palpitations and leg swelling.  Gastrointestinal:  Negative for abdominal distention, abdominal pain, blood in stool, constipation, diarrhea, nausea and vomiting.  Genitourinary:  Negative for difficulty urinating and hematuria.  Musculoskeletal:  Negative for arthralgias, myalgias and neck pain.  Skin:  Negative  for rash.  Neurological:  Negative for dizziness, seizures, syncope and headaches.  Hematological:  Negative for adenopathy. Does not bruise/bleed easily.  Psychiatric/Behavioral:  Negative for dysphoric mood. The patient is not nervous/anxious.     Objective:  BP 138/78   Pulse 94   Temp (!) 97.5 F (36.4 C) (Temporal)   Ht 5\' 8"  (1.727 m)   Wt 227 lb 4 oz (103.1 kg)   SpO2 97%   BMI 34.55 kg/m   Wt Readings from Last 3 Encounters:  04/13/22 227 lb 4 oz  (103.1 kg)  06/16/21 215 lb (97.5 kg)  03/01/21 223 lb (101.2 kg)      Physical Exam Vitals and nursing note reviewed.  Constitutional:      General: He is not in acute distress.    Appearance: Normal appearance. He is well-developed. He is not ill-appearing.  HENT:     Head: Normocephalic and atraumatic.     Right Ear: Hearing, tympanic membrane, ear canal and external ear normal.     Left Ear: Hearing, tympanic membrane, ear canal and external ear normal.  Eyes:     General: No scleral icterus.    Extraocular Movements: Extraocular movements intact.     Conjunctiva/sclera: Conjunctivae normal.     Pupils: Pupils are equal, round, and reactive to light.  Neck:     Thyroid: No thyroid mass or thyromegaly.  Cardiovascular:     Rate and Rhythm: Normal rate and regular rhythm.     Pulses: Normal pulses.          Radial pulses are 2+ on the right side and 2+ on the left side.     Heart sounds: Normal heart sounds. No murmur heard. Pulmonary:     Effort: Pulmonary effort is normal. No respiratory distress.     Breath sounds: Normal breath sounds. No wheezing, rhonchi or rales.  Abdominal:     General: Bowel sounds are normal. There is no distension.     Palpations: Abdomen is soft. There is no mass.     Tenderness: There is no abdominal tenderness. There is no guarding or rebound.     Hernia: No hernia is present.  Musculoskeletal:        General: Normal range of motion.     Cervical back: Normal range of motion and neck supple.     Right lower leg: No edema.     Left lower leg: No edema.  Lymphadenopathy:     Cervical: No cervical adenopathy.  Skin:    General: Skin is warm and dry.     Findings: Rash present.     Comments: Hypopigmented macules to back and abdomen without scaling  Neurological:     General: No focal deficit present.     Mental Status: He is alert and oriented to person, place, and time.  Psychiatric:        Mood and Affect: Mood normal.        Behavior:  Behavior normal.        Thought Content: Thought content normal.        Judgment: Judgment normal.       Results for orders placed or performed in visit on 04/06/22  CBC with Differential/Platelet  Result Value Ref Range   WBC 5.2 4.0 - 10.5 K/uL   RBC 4.29 4.22 - 5.81 Mil/uL   Hemoglobin 13.5 13.0 - 17.0 g/dL   HCT 04/08/22 (L) 01.0 - 27.2 %   MCV 90.6 78.0 - 100.0 fl  MCHC 34.6 30.0 - 36.0 g/dL   RDW 44.0 10.2 - 72.5 %   Platelets 255.0 150.0 - 400.0 K/uL   Neutrophils Relative % 45.3 43.0 - 77.0 %   Lymphocytes Relative 43.8 12.0 - 46.0 %   Monocytes Relative 7.4 3.0 - 12.0 %   Eosinophils Relative 2.9 0.0 - 5.0 %   Basophils Relative 0.6 0.0 - 3.0 %   Neutro Abs 2.4 1.4 - 7.7 K/uL   Lymphs Abs 2.3 0.7 - 4.0 K/uL   Monocytes Absolute 0.4 0.1 - 1.0 K/uL   Eosinophils Absolute 0.2 0.0 - 0.7 K/uL   Basophils Absolute 0.0 0.0 - 0.1 K/uL  Microalbumin / creatinine urine ratio  Result Value Ref Range   Microalb, Ur 0.7 0.0 - 1.9 mg/dL   Creatinine,U 366.4 mg/dL   Microalb Creat Ratio 0.4 0.0 - 30.0 mg/g  Uric acid  Result Value Ref Range   Uric Acid, Serum 4.7 4.0 - 7.8 mg/dL  PSA  Result Value Ref Range   PSA 0.50 0.10 - 4.00 ng/mL  Hemoglobin A1c  Result Value Ref Range   Hgb A1c MFr Bld 6.8 (H) 4.6 - 6.5 %  Comprehensive metabolic panel  Result Value Ref Range   Sodium 141 135 - 145 mEq/L   Potassium 4.1 3.5 - 5.1 mEq/L   Chloride 106 96 - 112 mEq/L   CO2 26 19 - 32 mEq/L   Glucose, Bld 142 (H) 70 - 99 mg/dL   BUN 13 6 - 23 mg/dL   Creatinine, Ser 4.03 0.40 - 1.50 mg/dL   Total Bilirubin 0.3 0.2 - 1.2 mg/dL   Alkaline Phosphatase 68 39 - 117 U/L   AST 22 0 - 37 U/L   ALT 38 0 - 53 U/L   Total Protein 6.8 6.0 - 8.3 g/dL   Albumin 4.6 3.5 - 5.2 g/dL   GFR 47.42 >59.56 mL/min   Calcium 9.4 8.4 - 10.5 mg/dL  Lipid panel  Result Value Ref Range   Cholesterol 177 0 - 200 mg/dL   Triglycerides 387.5 (H) 0.0 - 149.0 mg/dL   HDL 64.33 (L) >29.51 mg/dL   VLDL 88.4  (H) 0.0 - 40.0 mg/dL   Total CHOL/HDL Ratio 5    NonHDL 139.99   LDL cholesterol, direct  Result Value Ref Range   Direct LDL 98.0 mg/dL    Assessment & Plan:   Problem List Items Addressed This Visit     Health maintenance examination - Primary (Chronic)    Preventative protocols reviewed and updated unless pt declined. Discussed healthy diet and lifestyle.       Dyslipidemia    Chronic, overall improved TC and LDL on atorvastatin 20mg  daily started last year but triglycerides stay high (partly driven by hyperglycemia) and HDL remains low - continue statin with fenofibrate. Reassess with weight loss and better sugar control. The 10-year ASCVD risk score (Arnett DK, et al., 2019) is: 5.4%   Values used to calculate the score:     Age: 36 years     Sex: Male     Is Non-Hispanic African American: No     Diabetic: No     Tobacco smoker: No     Systolic Blood Pressure: 138 mmHg     Is BP treated: Yes     HDL Cholesterol: 36.7 mg/dL     Total Cholesterol: 177 mg/dL       Relevant Medications   atorvastatin (LIPITOR) 20 MG tablet   fenofibrate micronized (LOFIBRA) 134  MG capsule   HTN (hypertension)    Chronic, stable. Continue current regimen of low dose amlodipine.       Relevant Medications   amLODipine (NORVASC) 2.5 MG tablet   atorvastatin (LIPITOR) 20 MG tablet   fenofibrate micronized (LOFIBRA) 134 MG capsule   Diet-controlled diabetes mellitus (HCC)    Reviewed A1c trend into diabetes range, albeit controlled at this time. Encouraged low sugar low carb diabetic diet, nutrition handout provided, recommend return in 6 months for DM f/u visit.  Pt agrees with plan.       Relevant Medications   atorvastatin (LIPITOR) 20 MG tablet   Hypopigmented skin lesion    Question tinea versicolor however patient states dermatology said it was not fungal.  No dermatology records available.  We will continue to monitor.      Chronic gout    Chronic, stable period on  allopurinol 200 mg daily without recent flare.  Patient desires to continue at this dose, consider taper next year if remains stable.      Obesity, Class I, BMI 30-34.9    Weight gain noted.  Encouraged healthy diet and lifestyle choices to affect sustainable weight loss, especially in setting of new diabetes diagnosis.        Meds ordered this encounter  Medications   allopurinol (ZYLOPRIM) 100 MG tablet    Sig: TAKE 2 TABLETS(200 MG) BY MOUTH DAILY    Dispense:  180 tablet    Refill:  3   amLODipine (NORVASC) 2.5 MG tablet    Sig: TAKE 1 TABLET(2.5 MG) BY MOUTH DAILY    Dispense:  90 tablet    Refill:  3   atorvastatin (LIPITOR) 20 MG tablet    Sig: TAKE 1 TABLET(20 MG) BY MOUTH DAILY    Dispense:  90 tablet    Refill:  3   fenofibrate micronized (LOFIBRA) 134 MG capsule    Sig: TAKE 1 CAPSULE(134 MG) BY MOUTH DAILY BEFORE BREAKFAST    Dispense:  90 capsule    Refill:  3   No orders of the defined types were placed in this encounter.   Patient instructions: Consider shingrix shot series.  Good to see you today, return as needed or in 6 months for diabetes follow up.  Work on low sugar low carb diabetic diet in interim.  Follow up plan: Return in about 6 months (around 10/14/2022) for follow up visit.  Eustaquio Boyden, MD

## 2022-05-29 ENCOUNTER — Telehealth: Payer: Self-pay | Admitting: Family Medicine

## 2022-05-29 ENCOUNTER — Other Ambulatory Visit: Payer: Self-pay | Admitting: Family Medicine

## 2022-05-29 NOTE — Telephone Encounter (Signed)
  Encourage patient to contact the pharmacy for refills or they can request refills through [facility]  Did the patient contact the pharmacy:  y   LAST APPOINTMENT DATE: 04/13/22  NEXT APPOINTMENT DATE:  MEDICATION:atorvastatin (LIPITOR) 20 MG tablet  Is the patient out of medication? y  If not, how much is left?  Is this a 77 day supply: y  PHARMACY: [facility] [phone] - [place], [place] AT [place] Phone:  [phone]  Fax:  [phone]      Let patient know to contact pharmacy at the end of the day to make sure medication is ready.  Please notify patient to allow 48-72 hours to process

## 2022-05-29 NOTE — Telephone Encounter (Signed)
Last rx sent to [facility] on 04/13/22, #90 with 3 additional refills.  Pt needs to speak with the pharmacy asking to fill newest rx.

## 2022-07-03 ENCOUNTER — Telehealth: Payer: Self-pay | Admitting: Family Medicine

## 2022-07-03 NOTE — Telephone Encounter (Signed)
Rx sent on 04/13/22, #90 with 1 year of refills to [facility].   Left message on vm per dpr relaying info above.

## 2022-07-03 NOTE — Telephone Encounter (Signed)
  Encourage patient to contact the pharmacy for refills or they can request refills through [facility]:  Please schedule appointment if longer than 1 year  NEXT APPOINTMENT DATE:  MEDICATION:fenofibrate micronized (LOFIBRA) 134 MG capsule   Is the patient out of medication?   [place], [place]   Let patient know to contact pharmacy at the end of the day to make sure medication is ready.  Please notify patient to allow 48-72 hours to process  CLINICAL FILLS OUT ALL BELOW:   LAST REFILL:  QTY:  REFILL DATE:    OTHER COMMENTS:    Okay for refill?  Please advise

## 2022-07-10 ENCOUNTER — Other Ambulatory Visit: Payer: Self-pay | Admitting: Family Medicine

## 2022-10-15 ENCOUNTER — Ambulatory Visit: Payer: BC Managed Care – PPO | Admitting: Family Medicine

## 2022-10-15 ENCOUNTER — Encounter: Payer: Self-pay | Admitting: Family Medicine

## 2022-10-15 VITALS — BP 132/84 | HR 102 | Temp 97.4°F | Ht 68.0 in | Wt 229.4 lb

## 2022-10-15 DIAGNOSIS — E1169 Type 2 diabetes mellitus with other specified complication: Secondary | ICD-10-CM

## 2022-10-15 DIAGNOSIS — E669 Obesity, unspecified: Secondary | ICD-10-CM

## 2022-10-15 LAB — POCT GLYCOSYLATED HEMOGLOBIN (HGB A1C): Hemoglobin A1C: 6.7 % — AB (ref 4.0–5.6)

## 2022-10-15 MED ORDER — LISINOPRIL 2.5 MG PO TABS: 2.5000 mg | ORAL_TABLET | Freq: Every day | ORAL | 6 refills | Status: DC

## 2022-10-15 MED ORDER — OZEMPIC (0.25 OR 0.5 MG/DOSE) 2 MG/3ML ~~LOC~~ SOPN: PEN_INJECTOR | SUBCUTANEOUS | 6 refills | Status: AC

## 2022-10-15 NOTE — Assessment & Plan Note (Addendum)
Discussed diabetes diagnosis as well as treatment options. Reviewed diet choices to keep sugars under control.  Rec yearly diabetic eye exam.  Patient is interested in weekly GLP1RA. Reviewed mechanism of action of medication as well as side effects and adverse events to watch for including nausea, diarrhea, constipation, pancreatitis. No fmhx medullary thyroid cancer. Discussed titration schedule for medication. Will start ozempic 0.25mg  weekly.  RTC 6 mo CPE, will recheck diabetes control at that time.

## 2022-10-15 NOTE — Progress Notes (Addendum)
Patient ID: Shawn Price, male    DOB: September 16, 1970, 52 y.o.   MRN: [id number]  This visit was conducted in person.  BP 132/84   Pulse (!) 102   Temp (!) 97.4 F (36.3 C) (Temporal)   Ht 5\' 8"  (1.727 m)   Wt 229 lb 6 oz (104 kg) Comment: Pt wearing work gear  SpO2 96%   BMI 34.88 kg/m    CC: 6 mo f/u visit Subjective:   HPI: Shawn Price is a 52 y.o. male presenting on 10/15/2022 for Medical Management of Chronic Issues (Here for 6 mo DM f/u.)   DM - recent new diagnosis at last physical. Does not regularly check sugars. Compliant with antihyperglycemic regimen which includes: diet controlled. He's cut down sugarin diet - zero sugar or flavor packets in drinks. Uses splenda in coffee (about 4 cups/wk). Has been walking more regularly - goal 10k steps/day. Denies low sugars or hypoglycemic symptoms. Denies paresthesias, blurry vision. Last diabetic eye exam DUE - discussed. Glucometer brand: doesn't have this. Last foot exam: DUE. DSME: has not completed.  Lab Results  Component Value Date   HGBA1C 6.7 (A) 10/15/2022   Diabetic Foot Exam - Simple   Simple Foot Form Diabetic Foot exam was performed with the following findings: Yes 10/15/2022  8:41 AM  Visual Inspection No deformities, no ulcerations, no other skin breakdown bilaterally: Yes Sensation Testing Intact to touch and monofilament testing bilaterally: Yes Pulse Check Posterior Tibialis and Dorsalis pulse intact bilaterally: Yes Comments    Lab Results  Component Value Date   MICROALBUR 0.7 04/06/2022    Notes difficulty losing weight despite regular walking routine.      Relevant past medical, surgical, family and social history reviewed and updated as indicated. Interim medical history since our last visit reviewed. Allergies and medications reviewed and updated. Outpatient Medications Prior to Visit  Medication Sig Dispense Refill   allopurinol (ZYLOPRIM) 100 MG tablet TAKE 2 TABLETS(200 MG) BY MOUTH DAILY 180  tablet 3   atorvastatin (LIPITOR) 20 MG tablet TAKE 1 TABLET(20 MG) BY MOUTH DAILY 90 tablet 3   clobetasol (TEMOVATE) 0.05 % external solution Apply topically 2 (two) times daily. As needed     colchicine 0.6 MG tablet Take 1 tablet (0.6 mg total) by mouth daily as needed (gout flare). May take 2 tablets for first dose 30 tablet 3   fenofibrate micronized (LOFIBRA) 134 MG capsule TAKE 1 CAPSULE(134 MG) BY MOUTH DAILY BEFORE BREAKFAST 90 capsule 3   Multiple Vitamins-Minerals (MULTIVITAMIN MEN 50+ PO) Take by mouth daily.     Omega-3 Fatty Acids (FISH OIL) 1000 MG CAPS Take 1 capsule by mouth daily in the afternoon.     amLODipine (NORVASC) 2.5 MG tablet TAKE 1 TABLET(2.5 MG) BY MOUTH DAILY 90 tablet 3   No facility-administered medications prior to visit.     Per HPI unless specifically indicated in ROS section below Review of Systems  Objective:  BP 132/84   Pulse (!) 102   Temp (!) 97.4 F (36.3 C) (Temporal)   Ht 5\' 8"  (1.727 m)   Wt 229 lb 6 oz (104 kg) Comment: Pt wearing work gear  SpO2 96%   BMI 34.88 kg/m   Wt Readings from Last 3 Encounters:  10/15/22 229 lb 6 oz (104 kg)  04/13/22 227 lb 4 oz (103.1 kg)  06/16/21 215 lb (97.5 kg)      Physical Exam Vitals and nursing note reviewed.  Constitutional:  Appearance: Normal appearance. He is not ill-appearing.  HENT:     Head: Normocephalic and atraumatic.  Eyes:     Extraocular Movements: Extraocular movements intact.     Conjunctiva/sclera: Conjunctivae normal.     Pupils: Pupils are equal, round, and reactive to light.  Cardiovascular:     Rate and Rhythm: Normal rate and regular rhythm.     Pulses: Normal pulses.     Heart sounds: Normal heart sounds. No murmur heard. Pulmonary:     Effort: Pulmonary effort is normal. No respiratory distress.     Breath sounds: Normal breath sounds. No wheezing, rhonchi or rales.  Musculoskeletal:     Right lower leg: No edema.     Left lower leg: No edema.      Comments: See HPI for foot exam if done  Skin:    General: Skin is warm and dry.     Findings: No rash.  Neurological:     Mental Status: He is alert.  Psychiatric:        Mood and Affect: Mood normal.        Behavior: Behavior normal.       Results for orders placed or performed in visit on 10/15/22  POCT glycosylated hemoglobin (Hb A1C)  Result Value Ref Range   Hemoglobin A1C 6.7 (A) 4.0 - 5.6 %   HbA1c POC (<> result, manual entry)     HbA1c, POC (prediabetic range)     HbA1c, POC (controlled diabetic range)     Lab Results  Component Value Date   CREATININE 1.07 04/06/2022   BUN 13 04/06/2022   NA 141 04/06/2022   K 4.1 04/06/2022   CL 106 04/06/2022   CO2 26 04/06/2022   GFR = 81     10/15/2022    8:10 AM 04/13/2022   12:20 PM 03/01/2021    2:20 PM 05/23/2017    9:40 AM  Depression screen PHQ 2/9  Decreased Interest 0 0 0 0  Down, Depressed, Hopeless 0 0 0 0  PHQ - 2 Score 0 0 0 0  Altered sleeping 3     Tired, decreased energy 1     Change in appetite 0     Feeling bad or failure about yourself  0     Trouble concentrating 0     Moving slowly or fidgety/restless 0     Suicidal thoughts 0     PHQ-9 Score 4     Difficult doing work/chores Not difficult at all          10/15/2022    8:10 AM  GAD 7 : Generalized Anxiety Score  Nervous, Anxious, on Edge 0  Control/stop worrying 0  Worry too much - different things 0  Trouble relaxing 0  Restless 0  Easily annoyed or irritable 1  Afraid - awful might happen 0  Total GAD 7 Score 1  Anxiety Difficulty Not difficult at all   Assessment & Plan:   Problem List Items Addressed This Visit     Type 2 diabetes mellitus with other specified complication ([place]) - Primary    Discussed diabetes diagnosis as well as treatment options. Reviewed diet choices to keep sugars under control.  Rec yearly diabetic eye exam.  Patient is interested in weekly GLP1RA. Reviewed mechanism of action of medication as well as side  effects and adverse events to watch for including nausea, diarrhea, constipation, pancreatitis. No fmhx medullary thyroid cancer. Discussed titration schedule for medication. Will start ozempic 0.25mg   weekly.  RTC 6 mo CPE, will recheck diabetes control at that time.       Relevant Medications   lisinopril (ZESTRIL) 2.5 MG tablet   Semaglutide,0.25 or 0.5MG /DOS, (OZEMPIC, 0.25 OR 0.5 MG/DOSE,) 2 MG/3ML SOPN   Other Relevant Orders   POCT glycosylated hemoglobin (Hb A1C) (Completed)   Obesity, Class I, BMI 30-34.9    Encouraged continued efforts towards healthy diet and lifestyle choices for sustainable weight loss.         Meds ordered this encounter  Medications   lisinopril (ZESTRIL) 2.5 MG tablet    Sig: Take 1 tablet (2.5 mg total) by mouth daily.    Dispense:  30 tablet    Refill:  6    To replace amlodipine   Semaglutide,0.25 or 0.5MG /DOS, (OZEMPIC, 0.25 OR 0.5 MG/DOSE,) 2 MG/3ML SOPN    Sig: Inject 0.25 mg into the skin once a week for 14 days, THEN 0.5 mg once a week.    Dispense:  3 mL    Refill:  6    Orders Placed This Encounter  Procedures   POCT glycosylated hemoglobin (Hb A1C)    Patient Instructions  Sugars are stable in controlled diabetes range.  Start lisinopril 2.5mg  daily in place of amlodipine.  Price out ozempic - start at 0.25mg  weekly for 2 weeks then increase to 0.5mg  weekly.  Return in 6 months for physical.   Bedtime routine checklist: 1. Avoid naps during the day 2. Avoid stimulants such as caffeine and nicotine. Avoid bedtime alcohol (it can speed onset of sleep but the body's metabolism can cause awakenings). 3. All forms of exercise help ensure sound sleep - limit vigorous exercise to morning or late afternoon 4. Avoid food too close to bedtime including chocolate (which contains caffeine) 5. Soak up natural light 6. Establish regular bedtime routine. 7. Associate bed with sleep - avoid TV, computer or phone, reading while in bed. 8.  Ensure pleasant, relaxing sleep environment - quiet, dark, cool room.   Follow up plan: Return in about 6 months (around 04/15/2023) for annual exam, prior fasting for blood work.  Ria Bush, MD

## 2022-10-15 NOTE — Assessment & Plan Note (Signed)
Encouraged continued efforts towards healthy diet and lifestyle choices for sustainable weight loss.

## 2022-10-15 NOTE — Patient Instructions (Addendum)
Sugars are stable in controlled diabetes range.  Start lisinopril 2.5mg  daily in place of amlodipine.  Price out ozempic - start at 0.25mg  weekly for 2 weeks then increase to 0.5mg  weekly.  Return in 6 months for physical.   Bedtime routine checklist: 1. Avoid naps during the day 2. Avoid stimulants such as caffeine and nicotine. Avoid bedtime alcohol (it can speed onset of sleep but the body's metabolism can cause awakenings). 3. All forms of exercise help ensure sound sleep - limit vigorous exercise to morning or late afternoon 4. Avoid food too close to bedtime including chocolate (which contains caffeine) 5. Soak up natural light 6. Establish regular bedtime routine. 7. Associate bed with sleep - avoid TV, computer or phone, reading while in bed. 8. Ensure pleasant, relaxing sleep environment - quiet, dark, cool room.

## 2022-10-23 ENCOUNTER — Encounter: Payer: Self-pay | Admitting: Family Medicine

## 2022-10-23 NOTE — Telephone Encounter (Signed)
Has request been received for prior auth.

## 2022-10-29 NOTE — Telephone Encounter (Signed)
Plz submit PA for Ozempic 0.25 mg/0.5 mg.

## 2022-11-02 ENCOUNTER — Other Ambulatory Visit (HOSPITAL_COMMUNITY): Payer: Self-pay

## 2022-11-02 NOTE — Telephone Encounter (Signed)
Pharmacy Patient Advocate Encounter  Prior Authorization for Ozempic has been approved.    PA# [id number] Effective dates: 11/02/2022 through 11/02/2023

## 2022-11-02 NOTE — Telephone Encounter (Signed)
Apologies for the delay, PA has been submitted. Thanks for you patience! Patient Advocate Encounter   Received notification from [facility] that prior authorization for Ozempic is required.   PA submitted on 11/02/2022 Key [id number] Status is pending

## 2022-11-18 DIAGNOSIS — I10 Essential (primary) hypertension: Secondary | ICD-10-CM | POA: Diagnosis not present

## 2022-11-18 DIAGNOSIS — R079 Chest pain, unspecified: Secondary | ICD-10-CM | POA: Diagnosis not present

## 2022-11-18 DIAGNOSIS — E785 Hyperlipidemia, unspecified: Secondary | ICD-10-CM | POA: Diagnosis not present

## 2022-11-18 DIAGNOSIS — R202 Paresthesia of skin: Secondary | ICD-10-CM | POA: Diagnosis not present

## 2022-11-18 DIAGNOSIS — R0789 Other chest pain: Secondary | ICD-10-CM | POA: Diagnosis not present

## 2022-11-18 DIAGNOSIS — E119 Type 2 diabetes mellitus without complications: Secondary | ICD-10-CM | POA: Diagnosis not present

## 2022-11-18 DIAGNOSIS — Z888 Allergy status to other drugs, medicaments and biological substances status: Secondary | ICD-10-CM | POA: Diagnosis not present

## 2022-11-18 DIAGNOSIS — K219 Gastro-esophageal reflux disease without esophagitis: Secondary | ICD-10-CM | POA: Diagnosis not present

## 2022-11-18 DIAGNOSIS — F17290 Nicotine dependence, other tobacco product, uncomplicated: Secondary | ICD-10-CM | POA: Diagnosis not present

## 2022-11-18 DIAGNOSIS — Z7982 Long term (current) use of aspirin: Secondary | ICD-10-CM | POA: Diagnosis not present

## 2023-04-10 ENCOUNTER — Encounter (INDEPENDENT_AMBULATORY_CARE_PROVIDER_SITE_OTHER): Payer: Self-pay

## 2023-04-20 ENCOUNTER — Other Ambulatory Visit: Payer: Self-pay | Admitting: Family Medicine

## 2023-04-20 DIAGNOSIS — M1A09X0 Idiopathic chronic gout, multiple sites, without tophus (tophi): Secondary | ICD-10-CM

## 2023-04-20 DIAGNOSIS — E1169 Type 2 diabetes mellitus with other specified complication: Secondary | ICD-10-CM

## 2023-04-20 DIAGNOSIS — E785 Hyperlipidemia, unspecified: Secondary | ICD-10-CM

## 2023-04-20 DIAGNOSIS — Z125 Encounter for screening for malignant neoplasm of prostate: Secondary | ICD-10-CM

## 2023-04-23 ENCOUNTER — Other Ambulatory Visit (INDEPENDENT_AMBULATORY_CARE_PROVIDER_SITE_OTHER): Payer: BC Managed Care – PPO

## 2023-04-23 DIAGNOSIS — E1169 Type 2 diabetes mellitus with other specified complication: Secondary | ICD-10-CM | POA: Diagnosis not present

## 2023-04-23 DIAGNOSIS — M1A09X0 Idiopathic chronic gout, multiple sites, without tophus (tophi): Secondary | ICD-10-CM | POA: Diagnosis not present

## 2023-04-23 DIAGNOSIS — Z125 Encounter for screening for malignant neoplasm of prostate: Secondary | ICD-10-CM | POA: Diagnosis not present

## 2023-04-23 DIAGNOSIS — E785 Hyperlipidemia, unspecified: Secondary | ICD-10-CM | POA: Diagnosis not present

## 2023-04-23 LAB — COMPREHENSIVE METABOLIC PANEL
ALT: 29 U/L (ref 0–53)
AST: 16 U/L (ref 0–37)
Albumin: 4.5 g/dL (ref 3.5–5.2)
Alkaline Phosphatase: 57 U/L (ref 39–117)
BUN: 15 mg/dL (ref 6–23)
CO2: 27 mEq/L (ref 19–32)
Calcium: 9.5 mg/dL (ref 8.4–10.5)
Chloride: 102 mEq/L (ref 96–112)
Creatinine, Ser: 1.05 mg/dL (ref 0.40–1.50)
GFR: 82.36 mL/min (ref >60.00)
Glucose, Bld: 108 mg/dL — ABNORMAL HIGH (ref 70–99)
Potassium: 4.3 mEq/L (ref 3.5–5.1)
Sodium: 137 mEq/L (ref 135–145)
Total Bilirubin: 0.3 mg/dL (ref 0.2–1.2)
Total Protein: 6.6 g/dL (ref 6.0–8.3)

## 2023-04-23 LAB — LIPID PANEL
Cholesterol: 185 mg/dL (ref 0–200)
HDL: 43.9 mg/dL (ref >39.00)
NonHDL: 140.76
Total CHOL/HDL Ratio: 4
Triglycerides: 316 mg/dL — ABNORMAL HIGH (ref 0.0–149.0)
VLDL: 63.2 mg/dL — ABNORMAL HIGH (ref 0.0–40.0)

## 2023-04-23 LAB — MICROALBUMIN / CREATININE URINE RATIO
Creatinine,U: 267.3 mg/dL
Microalb Creat Ratio: 0.5 mg/g (ref 0.0–30.0)
Microalb, Ur: 1.3 mg/dL (ref 0.0–1.9)

## 2023-04-23 LAB — PSA: PSA: 0.87 ng/mL (ref 0.10–4.00)

## 2023-04-23 LAB — HM DIABETES EYE EXAM

## 2023-04-23 LAB — LDL CHOLESTEROL, DIRECT: Direct LDL: 127 mg/dL

## 2023-04-23 LAB — URIC ACID: Uric Acid, Serum: 4.6 mg/dL (ref 4.0–7.8)

## 2023-04-23 LAB — HEMOGLOBIN A1C: Hgb A1c MFr Bld: 5.6 % (ref 4.6–6.5)

## 2023-04-30 ENCOUNTER — Ambulatory Visit: Payer: BC Managed Care – PPO | Admitting: Family Medicine

## 2023-04-30 ENCOUNTER — Encounter: Payer: Self-pay | Admitting: Family Medicine

## 2023-04-30 VITALS — BP 136/78 | HR 82 | Temp 97.9°F | Ht 68.0 in | Wt 210.2 lb

## 2023-04-30 DIAGNOSIS — E785 Hyperlipidemia, unspecified: Secondary | ICD-10-CM

## 2023-04-30 DIAGNOSIS — Z0000 Encounter for general adult medical examination without abnormal findings: Secondary | ICD-10-CM | POA: Diagnosis not present

## 2023-04-30 DIAGNOSIS — E669 Obesity, unspecified: Secondary | ICD-10-CM

## 2023-04-30 DIAGNOSIS — Z7985 Long-term (current) use of injectable non-insulin antidiabetic drugs: Secondary | ICD-10-CM

## 2023-04-30 DIAGNOSIS — E1169 Type 2 diabetes mellitus with other specified complication: Secondary | ICD-10-CM

## 2023-04-30 DIAGNOSIS — M1A09X0 Idiopathic chronic gout, multiple sites, without tophus (tophi): Secondary | ICD-10-CM

## 2023-04-30 DIAGNOSIS — I10 Essential (primary) hypertension: Secondary | ICD-10-CM | POA: Diagnosis not present

## 2023-04-30 DIAGNOSIS — Z23 Encounter for immunization: Secondary | ICD-10-CM

## 2023-04-30 MED ORDER — FISH OIL 1000 MG PO CAPS: 1.0000 | ORAL_CAPSULE | Freq: Every day | ORAL | Status: DC

## 2023-04-30 MED ORDER — FISH OIL 1000 MG PO CAPS: 2.0000 | ORAL_CAPSULE | Freq: Every day | ORAL | Status: AC

## 2023-04-30 MED ORDER — COLCHICINE 0.6 MG PO TABS: 0.6000 mg | ORAL_TABLET | Freq: Every day | ORAL | 1 refills | Status: AC | PRN

## 2023-04-30 MED ORDER — LISINOPRIL 2.5 MG PO TABS: 2.5000 mg | ORAL_TABLET | Freq: Every day | ORAL | 4 refills | Status: DC

## 2023-04-30 MED ORDER — FENOFIBRATE 134 MG PO CAPS: ORAL_CAPSULE | ORAL | 4 refills | Status: DC

## 2023-04-30 MED ORDER — ALLOPURINOL 100 MG PO TABS: ORAL_TABLET | ORAL | 4 refills | Status: DC

## 2023-04-30 MED ORDER — ATORVASTATIN CALCIUM 20 MG PO TABS: ORAL_TABLET | ORAL | 4 refills | Status: DC

## 2023-04-30 MED ORDER — SEMAGLUTIDE (1 MG/DOSE) 4 MG/3ML ~~LOC~~ SOPN: 1.0000 mg | PEN_INJECTOR | SUBCUTANEOUS | 4 refills | Status: DC

## 2023-04-30 NOTE — Progress Notes (Unsigned)
Ph: [phone] Fax: [phone]   Patient ID: Shawn Price, male    DOB: [birth year], 52 y.o.   MRN: [id number]  This visit was conducted in person.  BP 136/78   Pulse 82   Temp 97.9 F (36.6 C) (Temporal)   Ht 5\' 8"  (1.727 m)   Wt 210 lb 3.2 oz (95.3 kg) Comment: with some work gear on  SpO2 98%   BMI 31.96 kg/m   BP Readings from Last 3 Encounters:  04/30/23 136/78  10/15/22 132/84  04/13/22 138/78   CC: CPE Subjective:   HPI: Shawn Price is a 52 y.o. male presenting on 04/30/2023 for No chief complaint on file.   Recent 1 wk cruise to [place].  Lives in [place], works in Careers information officer for [employer], also Secondary school teacher at [employer].    H/o dissecting scalp cellulitis - saw derm, treated with accutane. No recent breakout.   Lost almost 20 lbs - at home 200s without police equipment. Plateaued.  Continues ozempic 0.5mg  weekly. Tolerating well without nausea, diarrhea, constipation or epigastric pain. Appetite has returned.  A1c prior to starting 6.8%.  Has changed diet, limited alcohol intake, limiting carbs.   Continues notes some orthostatic lightheadedness since staring low dose lisinopril.  Home BPs 120s/80s.    Preventative: COLONOSCOPY WITH PROPOFOL 06/16/2021 - WNL, int hem (Tahiliani, Varnita B, MD) Prostate cancer - no fmhx prostate cancer. Nocturia x1, no weakening stream  Lung cancer screen - not eligible  Flu shot yearly COVID vaccine - Moderna 10/2019, 11/2019, booster 12/2020 Pneumococcal vaccine - discussed Tdap 02/2021 Shingrix - discussed, start today  Seat belt use discussed  Sunscreen use discussed. No changing moles.  Non smoker - dips  Alcohol - rare  Dentist yearly - [facility], had bad infection 01/2022 after oral surgery Eye exam yearly   Remarried 2014 Lives with wife, 2 daughters and step son, 1 dog Occupation: Database administrator Edu: bachelor degree Activity: started regular exercise  routine - treadmill  Diet: good water, regular fruits/vegetables, zero soda products, 1 cup black coffee in am     Relevant past medical, surgical, family and social history reviewed and updated as indicated. Interim medical history since our last visit reviewed. Allergies and medications reviewed and updated. Outpatient Medications Prior to Visit  Medication Sig Dispense Refill   allopurinol (ZYLOPRIM) 100 MG tablet TAKE 2 TABLETS(200 MG) BY MOUTH DAILY 180 tablet 3   atorvastatin (LIPITOR) 20 MG tablet TAKE 1 TABLET(20 MG) BY MOUTH DAILY 90 tablet 3   colchicine 0.6 MG tablet Take 1 tablet (0.6 mg total) by mouth daily as needed (gout flare). May take 2 tablets for first dose 30 tablet 3   fenofibrate micronized (LOFIBRA) 134 MG capsule TAKE 1 CAPSULE(134 MG) BY MOUTH DAILY BEFORE BREAKFAST 90 capsule 3   lisinopril (ZESTRIL) 2.5 MG tablet Take 1 tablet (2.5 mg total) by mouth daily. 30 tablet 6   Multiple Vitamins-Minerals (MULTIVITAMIN MEN 50+ PO) Take by mouth daily.     Omega-3 Fatty Acids (FISH OIL) 1000 MG CAPS Take 1 capsule by mouth daily in the afternoon.     clobetasol (TEMOVATE) 0.05 % external solution Apply topically 2 (two) times daily. As needed (Patient not taking: Reported on 04/30/2023)     No facility-administered medications prior to visit.     Per HPI unless specifically indicated in ROS section below Review of Systems  Constitutional:  Negative for activity change, appetite change, chills, fatigue,  fever and unexpected weight change.  HENT:  Negative for hearing loss.   Eyes:  Negative for visual disturbance.  Respiratory:  Negative for cough, chest tightness, shortness of breath and wheezing.   Cardiovascular:  Negative for chest pain, palpitations and leg swelling.  Gastrointestinal:  Positive for constipation (ozempic related). Negative for abdominal distention, abdominal pain, blood in stool, diarrhea, nausea and vomiting.  Genitourinary:  Negative for  difficulty urinating and hematuria.  Musculoskeletal:  Negative for arthralgias, myalgias and neck pain.  Skin:  Negative for rash.  Neurological:  Negative for dizziness, seizures, syncope and headaches.  Hematological:  Negative for adenopathy. Does not bruise/bleed easily.  Psychiatric/Behavioral:  Negative for dysphoric mood. The patient is not nervous/anxious.     Objective:  BP 136/78   Pulse 82   Temp 97.9 F (36.6 C) (Temporal)   Ht 5\' 8"  (1.727 m)   Wt 210 lb 3.2 oz (95.3 kg) Comment: with some work gear on  SpO2 98%   BMI 31.96 kg/m   Wt Readings from Last 3 Encounters:  04/30/23 210 lb 3.2 oz (95.3 kg)  10/15/22 229 lb 6 oz (104 kg)  04/13/22 227 lb 4 oz (103.1 kg)      Physical Exam Vitals and nursing note reviewed.  Constitutional:      General: He is not in acute distress.    Appearance: Normal appearance. He is well-developed. He is not ill-appearing.  HENT:     Head: Normocephalic and atraumatic.     Right Ear: Hearing, tympanic membrane, ear canal and external ear normal.     Left Ear: Hearing, tympanic membrane, ear canal and external ear normal.     Mouth/Throat:     Mouth: Mucous membranes are moist.     Pharynx: Oropharynx is clear. No oropharyngeal exudate or posterior oropharyngeal erythema.  Eyes:     General: No scleral icterus.    Extraocular Movements: Extraocular movements intact.     Conjunctiva/sclera: Conjunctivae normal.     Pupils: Pupils are equal, round, and reactive to light.  Neck:     Thyroid: No thyroid mass or thyromegaly.  Cardiovascular:     Rate and Rhythm: Normal rate and regular rhythm.     Pulses: Normal pulses.          Radial pulses are 2+ on the right side and 2+ on the left side.     Heart sounds: Normal heart sounds. No murmur heard. Pulmonary:     Effort: Pulmonary effort is normal. No respiratory distress.     Breath sounds: Normal breath sounds. No wheezing, rhonchi or rales.  Abdominal:     General: Bowel  sounds are normal. There is no distension.     Palpations: Abdomen is soft. There is no mass.     Tenderness: There is no abdominal tenderness. There is no guarding or rebound.     Hernia: No hernia is present.  Musculoskeletal:        General: Normal range of motion.     Cervical back: Normal range of motion and neck supple.     Right lower leg: No edema.     Left lower leg: No edema.  Lymphadenopathy:     Cervical: No cervical adenopathy.  Skin:    General: Skin is warm and dry.     Findings: No rash.  Neurological:     General: No focal deficit present.     Mental Status: He is alert and oriented to person, place, and time.  Psychiatric:        Mood and Affect: Mood normal.        Behavior: Behavior normal.        Thought Content: Thought content normal.        Judgment: Judgment normal.       Results for orders placed or performed in visit on 04/23/23  Uric acid  Result Value Ref Range   Uric Acid, Serum 4.6 4.0 - 7.8 mg/dL  PSA  Result Value Ref Range   PSA 0.87 0.10 - 4.00 ng/mL  Comprehensive metabolic panel  Result Value Ref Range   Sodium 137 135 - 145 mEq/L   Potassium 4.3 3.5 - 5.1 mEq/L   Chloride 102 96 - 112 mEq/L   CO2 27 19 - 32 mEq/L   Glucose, Bld 108 (H) 70 - 99 mg/dL   BUN 15 6 - 23 mg/dL   Creatinine, Ser 5.36 0.40 - 1.50 mg/dL   Total Bilirubin 0.3 0.2 - 1.2 mg/dL   Alkaline Phosphatase 57 39 - 117 U/L   AST 16 0 - 37 U/L   ALT 29 0 - 53 U/L   Total Protein 6.6 6.0 - 8.3 g/dL   Albumin 4.5 3.5 - 5.2 g/dL   GFR 64.40 >34.74 mL/min   Calcium 9.5 8.4 - 10.5 mg/dL  Lipid panel  Result Value Ref Range   Cholesterol 185 0 - 200 mg/dL   Triglycerides 259.5 (H) 0.0 - 149.0 mg/dL   HDL 63.87 >56.43 mg/dL   VLDL 32.9 (H) 0.0 - 51.8 mg/dL   Total CHOL/HDL Ratio 4    NonHDL 140.76   Microalbumin / creatinine urine ratio  Result Value Ref Range   Microalb, Ur 1.3 0.0 - 1.9 mg/dL   Creatinine,U 841.6 mg/dL   Microalb Creat Ratio 0.5 0.0 - 30.0 mg/g   Hemoglobin A1c  Result Value Ref Range   Hgb A1c MFr Bld 5.6 4.6 - 6.5 %  LDL cholesterol, direct  Result Value Ref Range   Direct LDL 127.0 mg/dL    Assessment & Plan:   Problem List Items Addressed This Visit   None    No orders of the defined types were placed in this encounter.   No orders of the defined types were placed in this encounter.   There are no Patient Instructions on file for this visit.  Follow up plan: No follow-ups on file.  Eustaquio Boyden, MD

## 2023-04-30 NOTE — Assessment & Plan Note (Signed)
Preventative protocols reviewed and updated unless pt declined. Discussed healthy diet and lifestyle.  

## 2023-04-30 NOTE — Patient Instructions (Addendum)
We will request latest diabetic eye exam from [facility].  First shingrix shot today. Schedule nurse visit in 2-6 months for second and final shingles shot.  Good to see you today Return as needed or in 6 months for diabetes follow up and fasting labs.  Increase ozempic to 1mg  weekly - new dose sent to parmacy. Let us know if worsening side effects.

## 2023-04-30 NOTE — Assessment & Plan Note (Signed)
Chronic, well controlled on ozempic 0.5mg  weekly. Will increase dose to 1mg  weekly to further weight loss.

## 2023-05-01 NOTE — Assessment & Plan Note (Signed)
Chronic, stable on low dose lisinopril - continue.  

## 2023-05-01 NOTE — Assessment & Plan Note (Signed)
Chronic, chol levels remain above goal despite atorvastatin 20mg  daily. Reviewed diet choices to improve triglycerides. Reassess at 6 mo f/u and consider statin dose titration if remaining uncontrolled. The 10-year ASCVD risk score (Arnett DK, et al., 2019) is: 9.6%   Values used to calculate the score:     Age: 52 years     Sex: Male     Is Non-Hispanic African American: No     Diabetic: Yes     Tobacco smoker: No     Systolic Blood Pressure: 136 mmHg     Is BP treated: Yes     HDL Cholesterol: 43.9 mg/dL     Total Cholesterol: 185 mg/dL

## 2023-05-01 NOTE — Assessment & Plan Note (Signed)
Chronic, stable period on allopurinol without recent gout flare.

## 2023-05-01 NOTE — Assessment & Plan Note (Addendum)
Congratulated on weight loss to date (20 lbs)! Continue to encourage healthy diet and lifestyle choices to affect sustainable weight loss.  Continue ozempic, titrate dose to 1mg  weekly, monitoring for intolerances.

## 2023-06-27 ENCOUNTER — Other Ambulatory Visit: Payer: Self-pay | Admitting: Family Medicine

## 2023-07-02 ENCOUNTER — Ambulatory Visit: Payer: BC Managed Care – PPO

## 2023-07-09 ENCOUNTER — Ambulatory Visit (INDEPENDENT_AMBULATORY_CARE_PROVIDER_SITE_OTHER): Payer: BC Managed Care – PPO

## 2023-07-09 DIAGNOSIS — Z23 Encounter for immunization: Secondary | ICD-10-CM | POA: Diagnosis not present

## 2023-07-09 NOTE — Progress Notes (Signed)
Patient presented for 2nd shingles vaccine given by Ruthy Forry, CMA to right deltoid, patient voiced no concerns nor showed any signs of distress during injection.  

## 2023-08-27 ENCOUNTER — Encounter: Payer: Self-pay | Admitting: Family Medicine

## 2023-08-27 NOTE — Telephone Encounter (Signed)
 Care team updated and letter sent for eye exam notes.

## 2023-10-28 ENCOUNTER — Other Ambulatory Visit (HOSPITAL_COMMUNITY): Payer: Self-pay

## 2023-11-01 ENCOUNTER — Ambulatory Visit: Payer: BC Managed Care – PPO | Admitting: Family Medicine

## 2023-11-01 ENCOUNTER — Encounter: Payer: Self-pay | Admitting: Family Medicine

## 2023-11-01 VITALS — BP 138/84 | HR 100 | Temp 98.7°F | Ht 68.0 in | Wt 223.2 lb

## 2023-11-01 DIAGNOSIS — E1169 Type 2 diabetes mellitus with other specified complication: Secondary | ICD-10-CM

## 2023-11-01 DIAGNOSIS — Z6833 Body mass index (BMI) 33.0-33.9, adult: Secondary | ICD-10-CM | POA: Diagnosis not present

## 2023-11-01 DIAGNOSIS — E66811 Obesity, class 1: Secondary | ICD-10-CM

## 2023-11-01 DIAGNOSIS — E291 Testicular hypofunction: Secondary | ICD-10-CM | POA: Insufficient documentation

## 2023-11-01 DIAGNOSIS — Z7985 Long-term (current) use of injectable non-insulin antidiabetic drugs: Secondary | ICD-10-CM

## 2023-11-01 DIAGNOSIS — R5382 Chronic fatigue, unspecified: Secondary | ICD-10-CM

## 2023-11-01 LAB — TSH: TSH: 2.98 u[IU]/mL (ref 0.35–5.50)

## 2023-11-01 LAB — VITAMIN B12: Vitamin B-12: 373 pg/mL (ref 211–911)

## 2023-11-01 LAB — POCT GLYCOSYLATED HEMOGLOBIN (HGB A1C): Hemoglobin A1C: 6 % — AB (ref 4.0–5.6)

## 2023-11-01 LAB — TESTOSTERONE: Testosterone: 79.67 ng/dL — ABNORMAL LOW (ref 300.00–890.00)

## 2023-11-01 MED ORDER — SEMAGLUTIDE (1 MG/DOSE) 4 MG/3ML ~~LOC~~ SOPN: 1.0000 mg | PEN_INJECTOR | SUBCUTANEOUS | 2 refills | Status: DC

## 2023-11-01 NOTE — Patient Instructions (Addendum)
 Restart ozempic 0.5mg  weekly - for 1 month then option to increase to 1mg  weekly.  Lab test today  Good to see you today  Return as needed or in 6 months for physical

## 2023-11-01 NOTE — Assessment & Plan Note (Signed)
 Weight gain noted off ozempic - will restart.

## 2023-11-01 NOTE — Assessment & Plan Note (Addendum)
 Chronic, stable. Reviewed pt concerns over GLP1RA use. Agrees to restart 0.5mg  weekly Ozempic for 1 month then increase to 1mg  weekly. Reassess control at CPE

## 2023-11-01 NOTE — Assessment & Plan Note (Signed)
 Reasonable to check T given endorsed decreased libido and fatigue.  Check B12, TSH as well.  Discussed return for confirmatory testing if initial T low.  Discussed need for routine monitoring if testosterone replacement therapy started.

## 2023-11-01 NOTE — Progress Notes (Addendum)
 Ph: [phone] Fax: [phone]   Patient ID: Shawn Price, male    DOB: 04-Feb-1971, 53 y.o.   MRN: [id number]  This visit was conducted in person.  BP 138/84   Pulse 100   Temp 98.7 F (37.1 C) (Oral)   Ht 5\' 8"  (1.727 m)   Wt 223 lb 4 oz (101.3 kg) Comment: Wearing work gear  SpO2 95%   BMI 33.95 kg/m    CC: 6 mo DM f/u visit  Subjective:   HPI: Shawn Price is a 53 y.o. male presenting on 11/01/2023 for Medical Management of Chronic Issues (Here for 6 mo DM/chol f/u. )   13 lb weight gain since last visit after stopping Ozempic - stopped due to concerns over long term side effects. Weight loss stabilized at 198 lbs.   Goes to gym 6d /wk for the past 3 wks - cardio and weight.  Working on [employer].  Notes decreased libido as well as decreased vigor when working out. Notes decreased energy levels. Notes increased irritability. Ongoing issue for 1-2 yrs.  No significant depressed mood.  No ED.   DM - does not regularly check sugars. Compliant with antihyperglycemic regimen which includes: Ozempic 1mg  weekly - stopped 1 month ago due to concern over side effects. Didn't have significant side effects to med. Denies low sugars or hypoglycemic symptoms. Denies paresthesias, blurry vision. Last diabetic eye exam 08/2023 Dr Frazier Butt at [facility] - will update chart. Glucometer brand: doesn't have one. Last foot exam: 10/2022 - DUE. DSME: has not done.  Lab Results  Component Value Date   HGBA1C 6.0 (A) 11/01/2023   Diabetic Foot Exam - Simple   Simple Foot Form Diabetic Foot exam was performed with the following findings: Yes 11/01/2023  8:47 AM  Visual Inspection No deformities, no ulcerations, no other skin breakdown bilaterally: Yes Sensation Testing Intact to touch and monofilament testing bilaterally: Yes Pulse Check Posterior Tibialis and Dorsalis pulse intact bilaterally: Yes Comments No claudication    Lab Results  Component Value Date    MICROALBUR 1.3 04/23/2023         Relevant past medical, surgical, family and social history reviewed and updated as indicated. Interim medical history since our last visit reviewed. Allergies and medications reviewed and updated. Outpatient Medications Prior to Visit  Medication Sig Dispense Refill  . allopurinol (ZYLOPRIM) 100 MG tablet TAKE 2 TABLETS(200 MG) BY MOUTH DAILY 180 tablet 4  . atorvastatin (LIPITOR) 20 MG tablet TAKE 1 TABLET(20 MG) BY MOUTH DAILY 90 tablet 4  . colchicine 0.6 MG tablet Take 1 tablet (0.6 mg total) by mouth daily as needed (gout flare). May take 2 tablets for first dose 30 tablet 1  . fenofibrate micronized (LOFIBRA) 134 MG capsule TAKE 1 CAPSULE(134 MG) BY MOUTH DAILY BEFORE BREAKFAST 90 capsule 4  . lisinopril (ZESTRIL) 2.5 MG tablet Take 1 tablet (2.5 mg total) by mouth daily. 90 tablet 4  . Multiple Vitamins-Minerals (MULTIVITAMIN MEN 50+ PO) Take by mouth daily.    . Omega-3 Fatty Acids (FISH OIL) 1000 MG CAPS Take 2 capsules (2,000 mg total) by mouth daily.    . Semaglutide, 1 MG/DOSE, 4 MG/3ML SOPN Inject 1 mg as directed once a week. (Patient not taking: Reported on 11/01/2023) 9 mL 4   No facility-administered medications prior to visit.     Per HPI unless specifically indicated in ROS section below Review of Systems  Objective:  BP 138/84   Pulse 100  Temp 98.7 F (37.1 C) (Oral)   Ht 5\' 8"  (1.727 m)   Wt 223 lb 4 oz (101.3 kg) Comment: Wearing work gear  SpO2 95%   BMI 33.95 kg/m   Wt Readings from Last 3 Encounters:  11/01/23 223 lb 4 oz (101.3 kg)  04/30/23 210 lb 3.2 oz (95.3 kg)  10/15/22 229 lb 6 oz (104 kg)      Physical Exam Vitals and nursing note reviewed.  Constitutional:      Appearance: Normal appearance. He is not ill-appearing.  Eyes:     Extraocular Movements: Extraocular movements intact.     Conjunctiva/sclera: Conjunctivae normal.     Pupils: Pupils are equal, round, and reactive to light.  Cardiovascular:      Rate and Rhythm: Normal rate and regular rhythm.     Pulses: Normal pulses.     Heart sounds: Normal heart sounds. No murmur heard. Pulmonary:     Effort: Pulmonary effort is normal. No respiratory distress.     Breath sounds: Normal breath sounds. No wheezing, rhonchi or rales.  Musculoskeletal:     Right lower leg: No edema.     Left lower leg: No edema.     Comments: See HPI for foot exam if done  Skin:    General: Skin is warm and dry.     Findings: No rash.  Neurological:     Mental Status: He is alert.  Psychiatric:        Mood and Affect: Mood normal.        Behavior: Behavior normal.      Results for orders placed or performed in visit on 11/01/23  POCT glycosylated hemoglobin (Hb A1C)   Collection Time: 11/01/23  8:30 AM  Result Value Ref Range   Hemoglobin A1C 6.0 (A) 4.0 - 5.6 %   HbA1c POC (<> result, manual entry)     HbA1c, POC (prediabetic range)     HbA1c, POC (controlled diabetic range)      Assessment & Plan:   Problem List Items Addressed This Visit     Type 2 diabetes mellitus with other specified complication (HCC) - Primary   Chronic, stable. Reviewed pt concerns over GLP1RA use. Agrees to restart 0.5mg  weekly Ozempic for 1 month then increase to 1mg  weekly. Reassess control at CPE       Relevant Medications   Semaglutide, 1 MG/DOSE, 4 MG/3ML SOPN   Other Relevant Orders   POCT glycosylated hemoglobin (Hb A1C) (Completed)   Obesity, Class I, BMI 30-34.9   Weight gain noted off ozempic - will restart.       Chronic fatigue   Reasonable to check T given endorsed decreased libido and fatigue.  Check B12, TSH as well.  Discussed return for confirmatory testing if initial T low.  Discussed need for routine monitoring if testosterone replacement therapy started.       Relevant Orders   Testosterone   Vitamin B12   TSH     Meds ordered this encounter  Medications  . Semaglutide, 1 MG/DOSE, 4 MG/3ML SOPN    Sig: Inject 1 mg as directed  once a week.    Dispense:  9 mL    Refill:  2    Orders Placed This Encounter  Procedures  . Testosterone  . Vitamin B12  . TSH  . POCT glycosylated hemoglobin (Hb A1C)    Patient Instructions  Restart ozempic 0.5mg  weekly - for 1 month then option to increase to 1mg  weekly.  Lab test today  Good to see you today  Return as needed or in 6 months for physical   Follow up plan: Return in about 6 months (around 04/30/2024), or if symptoms worsen or fail to improve, for follow up visit.  Eustaquio Boyden, MD

## 2023-11-04 ENCOUNTER — Encounter: Payer: Self-pay | Admitting: Family Medicine

## 2023-11-04 ENCOUNTER — Other Ambulatory Visit: Payer: Self-pay | Admitting: Family Medicine

## 2023-11-04 DIAGNOSIS — E291 Testicular hypofunction: Secondary | ICD-10-CM

## 2023-11-04 NOTE — Telephone Encounter (Signed)
 Looks like pt was able to schedule lab visit for 11/05/23 at 9:00.

## 2023-11-05 ENCOUNTER — Other Ambulatory Visit (INDEPENDENT_AMBULATORY_CARE_PROVIDER_SITE_OTHER): Payer: BC Managed Care – PPO

## 2023-11-05 DIAGNOSIS — E291 Testicular hypofunction: Secondary | ICD-10-CM | POA: Diagnosis not present

## 2023-11-05 LAB — IBC PANEL
Iron: 111 ug/dL (ref 42–165)
Saturation Ratios: 29 % (ref 20.0–50.0)
TIBC: 382.2 ug/dL (ref 250.0–450.0)
Transferrin: 273 mg/dL (ref 212.0–360.0)

## 2023-11-05 LAB — FOLLICLE STIMULATING HORMONE: FSH: 4.3 m[IU]/mL (ref 1.4–18.1)

## 2023-11-05 LAB — T4, FREE: Free T4: 0.75 ng/dL (ref 0.60–1.60)

## 2023-11-05 LAB — LUTEINIZING HORMONE: LH: 1.68 m[IU]/mL (ref 1.50–9.30)

## 2023-11-06 LAB — TESTOSTERONE TOTAL,FREE,BIO, MALES
Albumin: 4.8 g/dL (ref 3.6–5.1)
Sex Hormone Binding: 14 nmol/L (ref 10–50)
Testosterone: 135 ng/dL — ABNORMAL LOW (ref 250–827)

## 2023-11-06 LAB — PROLACTIN: Prolactin: 7.7 ng/mL (ref 2.0–18.0)

## 2023-11-06 LAB — CORTISOL-AM, BLOOD: Cortisol - AM: 18.5 ug/dL

## 2023-11-08 ENCOUNTER — Encounter: Payer: Self-pay | Admitting: Family Medicine

## 2023-11-08 ENCOUNTER — Telehealth: Payer: Self-pay | Admitting: Family Medicine

## 2023-11-08 NOTE — Telephone Encounter (Signed)
 Copied from CRM [id number]. Topic: General - Other >> Nov 08, 2023 10:33 AM Deaijah H wrote: Reason for CRM: Patient called in to return missed call from "Joellen" please call [phone]

## 2023-11-08 NOTE — Telephone Encounter (Signed)
 Rtn pt's call (see today's pt msg).

## 2023-11-08 NOTE — Telephone Encounter (Signed)
 Spoke with pt to r/s 11/18/23 OV to 11/11/23 video visit (per Dr Reece Agar) at 11:30. Pt expresses his thanks.

## 2023-11-11 ENCOUNTER — Telehealth: Payer: Self-pay

## 2023-11-11 ENCOUNTER — Telehealth: Payer: BC Managed Care – PPO | Admitting: Family Medicine

## 2023-11-11 ENCOUNTER — Encounter: Payer: Self-pay | Admitting: Family Medicine

## 2023-11-11 ENCOUNTER — Other Ambulatory Visit (HOSPITAL_COMMUNITY): Payer: Self-pay

## 2023-11-11 VITALS — Ht 68.0 in | Wt 208.0 lb

## 2023-11-11 DIAGNOSIS — E291 Testicular hypofunction: Secondary | ICD-10-CM | POA: Diagnosis not present

## 2023-11-11 MED ORDER — TESTOSTERONE 20.25 MG/ACT (1.62%) TD GEL: 2.0000 | Freq: Every day | TRANSDERMAL | 5 refills | Status: DC

## 2023-11-11 NOTE — Addendum Note (Signed)
 Addended by: Eustaquio Boyden on: 11/11/2023 01:50 PM   Modules accepted: Orders

## 2023-11-11 NOTE — Telephone Encounter (Signed)
 Pt had video visit with Dr Reece Agar today, which testosterone gel 20.25 mg (1.62%) gel was prescribed. Dr Reece Agar requests PA be submitted.

## 2023-11-11 NOTE — Assessment & Plan Note (Addendum)
 Testing consistent with secondary hypogonadism, confirmed with two separate 8am Testosterone levels.  Reviewed benefits of testosterone replacement such as increased energy, libido, as well as risks of treatment including but not limited to increased cardiovascular risk, blood clot risk, possible effect on prostate cancer, hepatotoxicity and need to avoid secondary exposure in household members. Reviewed need to monitor prostate, cholesterol, blood counts, liver etc. Reviewed routes of administration and preferred topical replacement given it best matches physiologic testosterone effect.  After discussion he agrees to start topical testosterone replacement - start Androgel topical testosterone gel 1.62% 20.25mg /act 2 pumps daily. Will work on prior authorization process. Rec RTC 4-5 wks for rpt testosterone levels - ordered.

## 2023-11-11 NOTE — Telephone Encounter (Signed)
 Pharmacy Patient Advocate Encounter   Received notification from Pt Calls Messages that prior authorization for Testosterone 1.62% gel is required/requested.   Insurance verification completed.   The patient is insured through [facility] .   Per test claim: PA required; PA submitted to above mentioned insurance via CoverMyMeds Key/confirmation #/EOC [id number] Status is pending

## 2023-11-11 NOTE — Telephone Encounter (Signed)
 PA request has been Submitted. New Encounter has been or will be created for follow up. For additional info see Pharmacy Prior Auth telephone encounter from 11/11/2023.

## 2023-11-11 NOTE — Progress Notes (Addendum)
 Ph: [phone] Fax: [phone]   Patient ID: Shawn Price, male    DOB: August 11, 1971, 53 y.o.   MRN: [id number]  Virtual visit completed through MyChart, a video enabled telemedicine application. Due to national recommendations of social distancing due to COVID-19, a virtual visit is felt to be most appropriate for this patient at this time. Reviewed limitations, risks, security and privacy concerns of performing a virtual visit and the availability of in person appointments. I also reviewed that there may be a patient responsible charge related to this service. The patient agreed to proceed.   Patient location: in his office at work Provider location: Adult nurse at [facility], office Persons participating in this virtual visit: patient, provider   If any vitals were documented, they were collected by patient at home unless specified below.    Ht 5\' 8"  (1.727 m)   Wt 208 lb (94.3 kg)   BMI 31.63 kg/m    CC: discuss testosterone replacement therapy Subjective:   HPI: Shawn Price is a 53 y.o. male presenting on 11/11/2023 for Medical Management of Chronic Issues (Needs to discuss HRT- testosterone. )   See recent labs. T 79 (L), 135 (L) on confirmatory testing this past month.  Other hormone levels normal.  Notes decreased libido, fatigue, irritability, trouble losing weight despite regularly working out at gym 6d /wk - both cardio and resistance training as well as working on [employer].      Relevant past medical, surgical, family and social history reviewed and updated as indicated. Interim medical history since our last visit reviewed. Allergies and medications reviewed and updated. Outpatient Medications Prior to Visit  Medication Sig Dispense Refill   allopurinol (ZYLOPRIM) 100 MG tablet TAKE 2 TABLETS(200 MG) BY MOUTH DAILY 180 tablet 4   atorvastatin (LIPITOR) 20 MG tablet TAKE 1 TABLET(20 MG) BY MOUTH DAILY 90 tablet 4   B Complex Vitamins (VITAMIN B COMPLEX PO) Take  by mouth daily.     colchicine 0.6 MG tablet Take 1 tablet (0.6 mg total) by mouth daily as needed (gout flare). May take 2 tablets for first dose 30 tablet 1   fenofibrate micronized (LOFIBRA) 134 MG capsule TAKE 1 CAPSULE(134 MG) BY MOUTH DAILY BEFORE BREAKFAST 90 capsule 4   lisinopril (ZESTRIL) 2.5 MG tablet Take 1 tablet (2.5 mg total) by mouth daily. 90 tablet 4   Multiple Vitamins-Minerals (MULTIVITAMIN MEN 50+ PO) Take by mouth daily.     Omega-3 Fatty Acids (FISH OIL) 1000 MG CAPS Take 2 capsules (2,000 mg total) by mouth daily.     Semaglutide, 1 MG/DOSE, 4 MG/3ML SOPN Inject 1 mg as directed once a week. 9 mL 2   No facility-administered medications prior to visit.     Per HPI unless specifically indicated in ROS section below Review of Systems Objective:  Ht 5\' 8"  (1.727 m)   Wt 208 lb (94.3 kg)   BMI 31.63 kg/m   Wt Readings from Last 3 Encounters:  11/11/23 208 lb (94.3 kg)  11/01/23 223 lb 4 oz (101.3 kg)  04/30/23 210 lb 3.2 oz (95.3 kg)       Physical exam: Gen: alert, NAD, not ill appearing Pulm: speaks in complete sentences without increased work of breathing Psych: normal mood, normal thought content      Results for orders placed or performed in visit on 11/05/23  Follicle stimulating hormone   Collection Time: 11/05/23  7:52 AM  Result Value Ref Range   FSH 4.3  1.4 - 18.1 mIU/ML  Luteinizing hormone   Collection Time: 11/05/23  7:52 AM  Result Value Ref Range   LH 1.68 1.50 - 9.30 mIU/mL  Testosterone Total,Free,Bio, Males   Collection Time: 11/05/23  7:52 AM  Result Value Ref Range   Testosterone 135 (L) 250 - 827 ng/dL   Albumin 4.8 3.6 - 5.1 g/dL   Sex Hormone Binding 14 10 - 50 nmol/L   Testosterone, Free See below 46.0 - 224.0 pg/mL   Testosterone, Bioavailable  110.0 - 575.0 ng/dL  Cortisol-am, blood   Collection Time: 11/05/23  7:52 AM  Result Value Ref Range   Cortisol - AM 18.5 mcg/dL  Prolactin   Collection Time: 11/05/23  7:52 AM   Result Value Ref Range   Prolactin 7.7 2.0 - 18.0 ng/mL  IBC panel   Collection Time: 11/05/23  7:52 AM  Result Value Ref Range   Iron 111 42 - 165 ug/dL   Transferrin 098.1 191.4 - 360.0 mg/dL   Saturation Ratios 78.2 20.0 - 50.0 %   TIBC 382.2 250.0 - 450.0 mcg/dL  T4, free   Collection Time: 11/05/23  7:52 AM  Result Value Ref Range   Free T4 0.75 0.60 - 1.60 ng/dL   Lab Results  Component Value Date   HGBA1C 6.0 (A) 11/01/2023   Assessment & Plan:   Secondary male hypogonadism Assessment & Plan: Testing consistent with secondary hypogonadism, confirmed with two separate 8am Testosterone levels.  Reviewed benefits of testosterone replacement such as increased energy, libido, as well as risks of treatment including but not limited to increased cardiovascular risk, blood clot risk, possible effect on prostate cancer, hepatotoxicity and need to avoid secondary exposure in household members. Reviewed need to monitor prostate, cholesterol, blood counts, liver etc. Reviewed routes of administration and preferred topical replacement given it best matches physiologic testosterone effect.  After discussion he agrees to start topical testosterone replacement - start Androgel topical testosterone gel 1.62% 20.25mg /act 2 pumps daily. Will work on prior authorization process. Rec RTC 4-5 wks for rpt testosterone levels - ordered.   Orders: -     Hepatic function panel; Future -     CBC with Differential/Platelet; Future -     Testosterone; Future  Other orders -     Testosterone; Place 2 Pump onto the skin daily. Apply to shoulders and upper arms  Dispense: 75 g; Refill: 5     I discussed the assessment and treatment plan with the patient. The patient was provided an opportunity to ask questions and all were answered. The patient agreed with the plan and demonstrated an understanding of the instructions. The patient was advised to call back or seek an in-person evaluation if the symptoms  worsen or if the condition fails to improve as anticipated.  Follow up plan: No follow-ups on file.  Eustaquio Boyden, MD

## 2023-11-12 NOTE — Telephone Encounter (Signed)
 Noted.

## 2023-11-14 NOTE — Telephone Encounter (Signed)
 Pharmacy Patient Advocate Encounter  Received notification from [facility] that Prior Authorization for Testosterone 1.62% gel has been APPROVED from 11/11/23 to 11/10/24   PA #/Case ID/Reference #: [id number]  Approval letter indexed to media tab

## 2023-11-18 ENCOUNTER — Ambulatory Visit: Payer: BC Managed Care – PPO | Admitting: Family Medicine

## 2023-11-18 NOTE — Telephone Encounter (Signed)
 I have reached out to pharmacy they have ran test claim it will be $4 copay for patient.

## 2023-12-20 ENCOUNTER — Other Ambulatory Visit (INDEPENDENT_AMBULATORY_CARE_PROVIDER_SITE_OTHER)

## 2023-12-20 DIAGNOSIS — E291 Testicular hypofunction: Secondary | ICD-10-CM

## 2023-12-20 LAB — CBC WITH DIFFERENTIAL/PLATELET
Basophils Absolute: 0 10*3/uL (ref 0.0–0.1)
Basophils Relative: 0.5 % (ref 0.0–3.0)
Eosinophils Absolute: 0.1 10*3/uL (ref 0.0–0.7)
Eosinophils Relative: 2.2 % (ref 0.0–5.0)
HCT: 39.5 % (ref 39.0–52.0)
Hemoglobin: 13.8 g/dL (ref 13.0–17.0)
Lymphocytes Relative: 41.2 % (ref 12.0–46.0)
Lymphs Abs: 2.1 10*3/uL (ref 0.7–4.0)
MCHC: 34.8 g/dL (ref 30.0–36.0)
MCV: 90.9 fl (ref 78.0–100.0)
Monocytes Absolute: 0.4 10*3/uL (ref 0.1–1.0)
Monocytes Relative: 7.8 % (ref 3.0–12.0)
Neutro Abs: 2.5 10*3/uL (ref 1.4–7.7)
Neutrophils Relative %: 48.3 % (ref 43.0–77.0)
Platelets: 277 10*3/uL (ref 150.0–400.0)
RBC: 4.35 Mil/uL (ref 4.22–5.81)
RDW: 12.5 % (ref 11.5–15.5)
WBC: 5.1 10*3/uL (ref 4.0–10.5)

## 2023-12-20 LAB — HEPATIC FUNCTION PANEL
ALT: 21 U/L (ref 0–53)
AST: 13 U/L (ref 0–37)
Albumin: 5.2 g/dL (ref 3.5–5.2)
Alkaline Phosphatase: 54 U/L (ref 39–117)
Bilirubin, Direct: 0.1 mg/dL (ref 0.0–0.3)
Total Bilirubin: 0.5 mg/dL (ref 0.2–1.2)
Total Protein: 7.1 g/dL (ref 6.0–8.3)

## 2023-12-20 LAB — TESTOSTERONE: Testosterone: 206.18 ng/dL — ABNORMAL LOW (ref 300.00–890.00)

## 2023-12-23 ENCOUNTER — Other Ambulatory Visit: Payer: Self-pay | Admitting: Family Medicine

## 2023-12-23 ENCOUNTER — Encounter: Payer: Self-pay | Admitting: Family Medicine

## 2023-12-23 MED ORDER — TESTOSTERONE 20.25 MG/ACT (1.62%) TD GEL: 3.0000 | Freq: Every day | TRANSDERMAL | 3 refills | Status: DC

## 2023-12-25 NOTE — Telephone Encounter (Signed)
 Spoke with patient. He will check on cost for brain MRI w/ w/o with his insurance. CPT code 19147.

## 2024-01-12 ENCOUNTER — Other Ambulatory Visit: Payer: Self-pay | Admitting: Family Medicine

## 2024-01-12 DIAGNOSIS — E291 Testicular hypofunction: Secondary | ICD-10-CM

## 2024-01-16 ENCOUNTER — Other Ambulatory Visit (INDEPENDENT_AMBULATORY_CARE_PROVIDER_SITE_OTHER)

## 2024-01-16 DIAGNOSIS — E291 Testicular hypofunction: Secondary | ICD-10-CM | POA: Diagnosis not present

## 2024-01-16 LAB — TESTOSTERONE: Testosterone: 779.81 ng/dL (ref 300.00–890.00)

## 2024-01-17 ENCOUNTER — Encounter: Payer: Self-pay | Admitting: Family Medicine

## 2024-01-27 NOTE — Telephone Encounter (Signed)
 Rx sent 12/23/23, #150 g/3 refills to [facility].   Spoke with [employer] above and asking if there are refills available. States pt has refills, however, it's too early. Says pt can request next refill on 01/31/24.   Will notify pt of message from [facility].

## 2024-02-06 MED ORDER — TESTOSTERONE 20.25 MG/ACT (1.62%) TD GEL: 3.0000 | Freq: Every day | TRANSDERMAL | 3 refills | Status: DC

## 2024-02-06 NOTE — Addendum Note (Signed)
 Addended by: Claire Crick on: 02/06/2024 01:01 PM   Modules accepted: Orders

## 2024-04-25 ENCOUNTER — Other Ambulatory Visit: Payer: Self-pay | Admitting: Family Medicine

## 2024-04-25 DIAGNOSIS — Z7985 Long-term (current) use of injectable non-insulin antidiabetic drugs: Secondary | ICD-10-CM | POA: Insufficient documentation

## 2024-04-25 DIAGNOSIS — M1A09X0 Idiopathic chronic gout, multiple sites, without tophus (tophi): Secondary | ICD-10-CM

## 2024-04-25 DIAGNOSIS — E1169 Type 2 diabetes mellitus with other specified complication: Secondary | ICD-10-CM

## 2024-04-25 DIAGNOSIS — E291 Testicular hypofunction: Secondary | ICD-10-CM

## 2024-04-25 DIAGNOSIS — E785 Hyperlipidemia, unspecified: Secondary | ICD-10-CM

## 2024-04-29 ENCOUNTER — Other Ambulatory Visit (INDEPENDENT_AMBULATORY_CARE_PROVIDER_SITE_OTHER): Payer: BC Managed Care – PPO

## 2024-04-29 ENCOUNTER — Ambulatory Visit: Payer: Self-pay | Admitting: Family Medicine

## 2024-04-29 ENCOUNTER — Encounter: Payer: Self-pay | Admitting: Family Medicine

## 2024-04-29 DIAGNOSIS — E1169 Type 2 diabetes mellitus with other specified complication: Secondary | ICD-10-CM | POA: Diagnosis not present

## 2024-04-29 DIAGNOSIS — M1A09X0 Idiopathic chronic gout, multiple sites, without tophus (tophi): Secondary | ICD-10-CM

## 2024-04-29 DIAGNOSIS — E291 Testicular hypofunction: Secondary | ICD-10-CM

## 2024-04-29 DIAGNOSIS — E785 Hyperlipidemia, unspecified: Secondary | ICD-10-CM

## 2024-04-29 LAB — URIC ACID: Uric Acid, Serum: 4.6 mg/dL (ref 4.0–7.8)

## 2024-04-29 LAB — COMPREHENSIVE METABOLIC PANEL WITH GFR
ALT: 21 U/L (ref 0–53)
AST: 11 U/L (ref 0–37)
Albumin: 4.9 g/dL (ref 3.5–5.2)
Alkaline Phosphatase: 53 U/L (ref 39–117)
BUN: 15 mg/dL (ref 6–23)
CO2: 27 meq/L (ref 19–32)
Calcium: 9.9 mg/dL (ref 8.4–10.5)
Chloride: 106 meq/L (ref 96–112)
Creatinine, Ser: 1.2 mg/dL (ref 0.40–1.50)
GFR: 69.67 mL/min (ref >60.00)
Glucose, Bld: 114 mg/dL — ABNORMAL HIGH (ref 70–99)
Potassium: 4.2 meq/L (ref 3.5–5.1)
Sodium: 140 meq/L (ref 135–145)
Total Bilirubin: 0.4 mg/dL (ref 0.2–1.2)
Total Protein: 7.3 g/dL (ref 6.0–8.3)

## 2024-04-29 LAB — LIPID PANEL
Cholesterol: 167 mg/dL (ref 0–200)
HDL: 40 mg/dL (ref >39.00)
LDL Cholesterol: 81 mg/dL (ref 0–99)
NonHDL: 127.48
Total CHOL/HDL Ratio: 4
Triglycerides: 234 mg/dL — ABNORMAL HIGH (ref 0.0–149.0)
VLDL: 46.8 mg/dL — ABNORMAL HIGH (ref 0.0–40.0)

## 2024-04-29 LAB — MICROALBUMIN / CREATININE URINE RATIO
Creatinine,U: 104.5 mg/dL
Microalb Creat Ratio: UNDETERMINED mg/g (ref 0.0–30.0)
Microalb, Ur: <0.7 mg/dL

## 2024-04-29 LAB — HM DIABETES EYE EXAM

## 2024-04-29 LAB — VITAMIN B12: Vitamin B-12: 562 pg/mL (ref 211–911)

## 2024-04-29 LAB — TESTOSTERONE: Testosterone: 483.39 ng/dL (ref 300.00–890.00)

## 2024-04-29 LAB — HEMOGLOBIN A1C: Hgb A1c MFr Bld: 5.6 % (ref 4.6–6.5)

## 2024-04-30 DIAGNOSIS — E119 Type 2 diabetes mellitus without complications: Secondary | ICD-10-CM | POA: Diagnosis not present

## 2024-05-02 ENCOUNTER — Other Ambulatory Visit: Payer: Self-pay | Admitting: Family Medicine

## 2024-05-06 ENCOUNTER — Ambulatory Visit: Payer: BC Managed Care – PPO | Admitting: Family Medicine

## 2024-05-06 ENCOUNTER — Encounter: Payer: Self-pay | Admitting: Family Medicine

## 2024-05-06 VITALS — BP 122/82 | HR 83 | Temp 99.4°F | Ht 68.0 in | Wt 206.0 lb

## 2024-05-06 DIAGNOSIS — Z0000 Encounter for general adult medical examination without abnormal findings: Secondary | ICD-10-CM | POA: Diagnosis not present

## 2024-05-06 DIAGNOSIS — Z5181 Encounter for therapeutic drug level monitoring: Secondary | ICD-10-CM

## 2024-05-06 DIAGNOSIS — E1169 Type 2 diabetes mellitus with other specified complication: Secondary | ICD-10-CM

## 2024-05-06 DIAGNOSIS — Z79890 Hormone replacement therapy (postmenopausal): Secondary | ICD-10-CM

## 2024-05-06 DIAGNOSIS — Z125 Encounter for screening for malignant neoplasm of prostate: Secondary | ICD-10-CM

## 2024-05-06 DIAGNOSIS — Z7985 Long-term (current) use of injectable non-insulin antidiabetic drugs: Secondary | ICD-10-CM

## 2024-05-06 DIAGNOSIS — I10 Essential (primary) hypertension: Secondary | ICD-10-CM

## 2024-05-06 DIAGNOSIS — E785 Hyperlipidemia, unspecified: Secondary | ICD-10-CM

## 2024-05-06 DIAGNOSIS — E291 Testicular hypofunction: Secondary | ICD-10-CM | POA: Diagnosis not present

## 2024-05-06 DIAGNOSIS — Z6830 Body mass index (BMI) 30.0-30.9, adult: Secondary | ICD-10-CM

## 2024-05-06 DIAGNOSIS — M1A09X0 Idiopathic chronic gout, multiple sites, without tophus (tophi): Secondary | ICD-10-CM

## 2024-05-06 DIAGNOSIS — E66811 Obesity, class 1: Secondary | ICD-10-CM

## 2024-05-06 LAB — PSA: PSA: 0.9 ng/mL (ref 0.10–4.00)

## 2024-05-06 MED ORDER — FENOFIBRATE 134 MG PO CAPS: 134.0000 mg | ORAL_CAPSULE | Freq: Every day | ORAL | 3 refills | Status: DC

## 2024-05-06 MED ORDER — ALLOPURINOL 100 MG PO TABS: ORAL_TABLET | ORAL | 3 refills | Status: AC

## 2024-05-06 MED ORDER — TESTOSTERONE 20.25 MG/ACT (1.62%) TD GEL: 3.0000 | Freq: Every day | TRANSDERMAL | 5 refills | Status: AC

## 2024-05-06 MED ORDER — LISINOPRIL 2.5 MG PO TABS: 2.5000 mg | ORAL_TABLET | Freq: Every day | ORAL | 3 refills | Status: AC

## 2024-05-06 MED ORDER — ATORVASTATIN CALCIUM 20 MG PO TABS: ORAL_TABLET | ORAL | 3 refills | Status: AC

## 2024-05-06 MED ORDER — SEMAGLUTIDE (1 MG/DOSE) 4 MG/3ML ~~LOC~~ SOPN: 1.0000 mg | PEN_INJECTOR | SUBCUTANEOUS | 3 refills | Status: DC

## 2024-05-06 NOTE — Assessment & Plan Note (Signed)
 Chronic, stable but trig levels high. Rec continue limiting sugar/carbs.  Continue current regimen of atorva and fenofibrate .  The 10-year ASCVD risk score (Arnett DK, et al., 2019) is: 8.4%   Values used to calculate the score:     Age: 53 years     Clincally relevant sex: Male     Is Non-Hispanic African American: No     Diabetic: Yes     Tobacco smoker: No     Systolic Blood Pressure: 122 mmHg     Is BP treated: Yes     HDL Cholesterol: 40 mg/dL     Total Cholesterol: 167 mg/dL

## 2024-05-06 NOTE — Assessment & Plan Note (Signed)
 Check PSA. LFTs, CBC WNL.

## 2024-05-06 NOTE — Assessment & Plan Note (Signed)
 Reviewed healthy diet and lifestyle changes to effect sustainable weight loss.   Continue ozempic .

## 2024-05-06 NOTE — Assessment & Plan Note (Signed)
 Chronic, great control with ozempic . Continue 1mg  weekly.

## 2024-05-06 NOTE — Assessment & Plan Note (Addendum)
 Preventative protocols reviewed and updated unless pt declined. Discussed healthy diet and lifestyle.  Declined prevnar-20

## 2024-05-06 NOTE — Assessment & Plan Note (Signed)
Continue ozempic.   

## 2024-05-06 NOTE — Patient Instructions (Addendum)
 Check with insurance on cost of brain MRI with and without contrast - CPT code is [phone]. You are doing well today Continue current medicines.  Trial of lisinopril  2.5mg  tablets - monitor blood pressures occasionally and restart if running consistently >135/85.  Return in 6 months for testosterone  / DM follow up, may do virtual visit but I do want labs prior.

## 2024-05-06 NOTE — Progress Notes (Signed)
 Ph: [phone] Fax: [phone]   Patient ID: Shawn Price, male    DOB: 1970-11-09, 53 y.o.   MRN: [id number]  This visit was conducted in person.  BP 122/82   Pulse 83   Temp 99.4 F (37.4 C) (Oral)   Ht 5' 8 (1.727 m)   Wt 206 lb (93.4 kg)   SpO2 98%   BMI 31.32 kg/m   BP Readings from Last 3 Encounters:  05/06/24 122/82  11/01/23 138/84  04/30/23 136/78   CC: CPE Subjective:   HPI: [name] is a 53 y.o. male presenting on 05/06/2024 for Annual Exam   Recent trip back from [place] - still in vacation mode.   Lives in [place], works in Careers information officer for [employer], also Secondary school teacher at [employer].   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    DM - continues ozempic  1mg  weekly. Tolerating well without nausea, diarrhea, constipation or epigastric pain.  A1c prior to starting 6.8%.  Has changed diet, limited alcohol intake, limiting carbs, going to gym regularly.  Weight down 17 lbs since last visit.   Secondary hypogonadism:  T 79 (L), 135 (L) on confirmatory testing, latest T 483 - using 3 pumps daily.  Other hormone levels normal but LH 1.6, FSH 4.3.  General irritability - actually better with testosterone .   Preventative: COLONOSCOPY WITH PROPOFOL  06/16/2021 - WNL, int hem (Tahiliani, Varnita B, MD)  Prostate cancer - no fmhx prostate cancer. Nocturia x1, no weakening stream  Lung cancer screen - not eligible  Flu shot yearly COVID vaccine - Moderna 10/2019, 11/2019, booster 12/2020 Prevanr-20 discussed, declines Tdap 02/2021 Shingrix  - 04/2023, 06/2023 Seat belt use discussed  Sunscreen use discussed. No changing moles.  Sleep - averaging ~7 hours/night, restless sleep Non smoker - dips  Alcohol - rare  Dentist yearly - [facility], had bad infection 01/2022 after oral surgery Eye exam yearly    Remarried 2014 Lives with wife, 2 daughters and step son, 1 dog Occupation: Actor Edu: bachelor degree Activity: regularly works out at gym Diet: good water, regular fruits/vegetables, zero soda products, 1 cup black coffee in am     Relevant past medical, surgical, family and social history reviewed and updated as indicated. Interim medical history since our last visit reviewed. Allergies and medications reviewed and updated. Outpatient Medications Prior to Visit  Medication Sig Dispense Refill   B Complex Vitamins (VITAMIN B COMPLEX PO) Take by mouth daily.     Omega-3 Fatty Acids (FISH OIL ) 1000 MG CAPS Take 2 capsules (2,000 mg total) by mouth daily.     allopurinol  (ZYLOPRIM ) 100 MG tablet TAKE 2 TABLETS(200 MG) BY MOUTH DAILY 180 tablet 4   atorvastatin  (LIPITOR) 20 MG tablet TAKE 1 TABLET(20 MG) BY MOUTH DAILY 90 tablet 4   fenofibrate  micronized (LOFIBRA) 134 MG capsule TAKE 1 CAPSULE(134 MG) BY MOUTH DAILY BEFORE BREAKFAST 90 capsule 4   lisinopril  (ZESTRIL ) 2.5 MG tablet Take 1 tablet (2.5 mg total) by mouth daily. 90 tablet 4   Multiple Vitamins-Minerals (MULTIVITAMIN MEN 50+ PO) Take by mouth daily.     Semaglutide , 1 MG/DOSE, 4 MG/3ML SOPN Inject 1 mg as directed once a week. 9 mL 2   Testosterone  20.25 MG/ACT (1.62%) GEL Place 3 Pump onto the skin daily. Apply to shoulders and upper arms 150 g 3   colchicine  0.6 MG tablet Take 1 tablet (0.6 mg total) by mouth daily as  needed (gout flare). May take 2 tablets for first dose 30 tablet 1   No facility-administered medications prior to visit.     Per HPI unless specifically indicated in ROS section below Review of Systems  Constitutional:  Negative for activity change, appetite change, chills, fatigue, fever and unexpected weight change.  HENT:  Positive for postnasal drip (season changes). Negative for hearing loss.   Eyes:  Negative for visual disturbance.  Respiratory:  Negative for cough, chest tightness, shortness of breath and wheezing.   Cardiovascular:  Negative for chest pain, palpitations  and leg swelling.  Gastrointestinal:  Positive for constipation (due to ozempic ). Negative for abdominal distention, abdominal pain, blood in stool, diarrhea, nausea and vomiting.  Genitourinary:  Negative for difficulty urinating and hematuria.  Musculoskeletal:  Negative for arthralgias, myalgias and neck pain.  Skin:  Negative for rash.  Neurological:  Negative for dizziness, seizures, syncope and headaches.  Hematological:  Negative for adenopathy. Does not bruise/bleed easily.  Psychiatric/Behavioral:  Negative for dysphoric mood. The patient is not nervous/anxious.     Objective:  BP 122/82   Pulse 83   Temp 99.4 F (37.4 C) (Oral)   Ht 5' 8 (1.727 m)   Wt 206 lb (93.4 kg)   SpO2 98%   BMI 31.32 kg/m   Wt Readings from Last 3 Encounters:  05/06/24 206 lb (93.4 kg)  11/11/23 208 lb (94.3 kg)  11/01/23 223 lb 4 oz (101.3 kg)      Physical Exam Vitals and nursing note reviewed.  Constitutional:      General: He is not in acute distress.    Appearance: Normal appearance. He is well-developed. He is not ill-appearing.  HENT:     Head: Normocephalic and atraumatic.     Right Ear: Hearing, tympanic membrane, ear canal and external ear normal.     Left Ear: Hearing, tympanic membrane, ear canal and external ear normal.     Nose: Nose normal.     Mouth/Throat:     Mouth: Mucous membranes are moist.     Pharynx: Oropharynx is clear. No oropharyngeal exudate or posterior oropharyngeal erythema.  Eyes:     General: No scleral icterus.    Extraocular Movements: Extraocular movements intact.     Conjunctiva/sclera: Conjunctivae normal.     Pupils: Pupils are equal, round, and reactive to light.  Neck:     Thyroid : No thyroid  mass or thyromegaly.  Cardiovascular:     Rate and Rhythm: Normal rate and regular rhythm.     Pulses: Normal pulses.          Radial pulses are 2+ on the right side and 2+ on the left side.     Heart sounds: Normal heart sounds. No murmur  heard. Pulmonary:     Effort: Pulmonary effort is normal. No respiratory distress.     Breath sounds: Normal breath sounds. No wheezing, rhonchi or rales.  Abdominal:     General: Bowel sounds are normal. There is no distension.     Palpations: Abdomen is soft. There is no mass.     Tenderness: There is no abdominal tenderness. There is no guarding or rebound.     Hernia: No hernia is present.  Musculoskeletal:        General: Normal range of motion.     Cervical back: Normal range of motion and neck supple.     Right lower leg: No edema.     Left lower leg: No edema.  Lymphadenopathy:  Cervical: No cervical adenopathy.  Skin:    General: Skin is warm and dry.     Findings: No rash.  Neurological:     General: No focal deficit present.     Mental Status: He is alert and oriented to person, place, and time.  Psychiatric:        Mood and Affect: Mood normal.        Behavior: Behavior normal.        Thought Content: Thought content normal.        Judgment: Judgment normal.       Results for orders placed or performed in visit on 04/29/24  HM DIABETES EYE EXAM   Collection Time: 04/29/24  4:47 PM  Result Value Ref Range   HM Diabetic Eye Exam No Retinopathy No Retinopathy   Lab Results  Component Value Date   PSA 0.87 04/23/2023   PSA 0.50 04/06/2022        05/06/2024    8:36 AM 11/11/2023   11:16 AM 11/01/2023    8:29 AM 04/30/2023   10:39 AM 10/15/2022    8:10 AM  Depression screen PHQ 2/9  Decreased Interest 0 0 0 0 0  Down, Depressed, Hopeless 0 0 0 0 0  PHQ - 2 Score 0 0 0 0 0  Altered sleeping 1 0 0 1 3  Tired, decreased energy 1 1 1  0 1  Change in appetite 0 0 0 0 0  Feeling bad or failure about yourself  0 0 0 0 0  Trouble concentrating 0 0 0 0 0  Moving slowly or fidgety/restless 0 0 0 0 0  Suicidal thoughts 0 0 0 0 0  PHQ-9 Score 2 1 1 1 4   Difficult doing work/chores  Not difficult at all Not difficult at all Not difficult at all Not difficult at all       05/06/2024    8:36 AM 11/11/2023   11:17 AM 11/01/2023    8:29 AM 04/30/2023   10:39 AM  GAD 7 : Generalized Anxiety Score  Nervous, Anxious, on Edge 0 0 0 0  Control/stop worrying 0 0 0 0  Worry too much - different things 0 0 0 0  Trouble relaxing 1 0 0 0  Restless 0 0 0 0  Easily annoyed or irritable 1 1 1 1   Afraid - awful might happen 0 0 0 0  Total GAD 7 Score 2 1 1 1   Anxiety Difficulty Not difficult at all Not difficult at all  Not difficult at all   Assessment & Plan:   Problem List Items Addressed This Visit     Health maintenance examination - Primary (Chronic)   Preventative protocols reviewed and updated unless pt declined. Discussed healthy diet and lifestyle.  Declined prevnar-20      Dyslipidemia   Chronic, stable but trig levels high. Rec continue limiting sugar/carbs.  Continue current regimen of atorva and fenofibrate .  The 10-year ASCVD risk score (Arnett DK, et al., 2019) is: 8.4%   Values used to calculate the score:     Age: 89 years     Clincally relevant sex: Male     Is Non-Hispanic African American: No     Diabetic: Yes     Tobacco smoker: No     Systolic Blood Pressure: 122 mmHg     Is BP treated: Yes     HDL Cholesterol: 40 mg/dL     Total Cholesterol: 167 mg/dL  Relevant Medications   atorvastatin  (LIPITOR) 20 MG tablet   fenofibrate  micronized (LOFIBRA) 134 MG capsule   HTN (hypertension)   Chronic, bp great control with weight loss.  May try off lisinopril  2.5mg , but restart if needed (bp trends up).       Relevant Medications   atorvastatin  (LIPITOR) 20 MG tablet   fenofibrate  micronized (LOFIBRA) 134 MG capsule   lisinopril  (ZESTRIL ) 2.5 MG tablet   Type 2 diabetes mellitus with other specified complication (HCC)   Chronic, great control with ozempic . Continue 1mg  weekly.       Relevant Medications   atorvastatin  (LIPITOR) 20 MG tablet   lisinopril  (ZESTRIL ) 2.5 MG tablet   Semaglutide , 1 MG/DOSE, 4 MG/3ML SOPN    Chronic gout   Chronic, great control on allopurinol  200mg  daily - continue.       Obesity, Class I, BMI 30-34.9   Reviewed healthy diet and lifestyle changes to effect sustainable weight loss.   Continue ozempic .       Secondary male hypogonadism   Doing well on testosterone  replacement 3 pumps daily - continue this.  Discussed brain imaging with MRI with/without contrast to complete workup of secondary hypogonadism with markedly low testosterone  levels prior to treatment , as well as low FSH/LH levels. He will price out MRI - CPT code provided.       Relevant Orders   PSA   Long-term (current) use of injectable non-insulin antidiabetic drugs   Continue ozempic .       Encounter for monitoring testosterone  replacement therapy   Check PSA. LFTs, CBC WNL.       Relevant Orders   PSA   Other Visit Diagnoses       Special screening for malignant neoplasm of prostate       Relevant Orders   PSA        Meds ordered this encounter  Medications   Testosterone  20.25 MG/ACT (1.62%) GEL    Sig: Place 3 Pump onto the skin daily. Apply to shoulders and upper arms    Dispense:  150 g    Refill:  5    Dispense 2 bottles due to increased dose   allopurinol  (ZYLOPRIM ) 100 MG tablet    Sig: TAKE 2 TABLETS(200 MG) BY MOUTH DAILY    Dispense:  180 tablet    Refill:  3   atorvastatin  (LIPITOR) 20 MG tablet    Sig: TAKE 1 TABLET(20 MG) BY MOUTH DAILY    Dispense:  90 tablet    Refill:  3   fenofibrate  micronized (LOFIBRA) 134 MG capsule    Sig: Take 1 capsule (134 mg total) by mouth daily before breakfast.    Dispense:  90 capsule    Refill:  3   lisinopril  (ZESTRIL ) 2.5 MG tablet    Sig: Take 1 tablet (2.5 mg total) by mouth daily.    Dispense:  90 tablet    Refill:  3   Semaglutide , 1 MG/DOSE, 4 MG/3ML SOPN    Sig: Inject 1 mg as directed once a week.    Dispense:  9 mL    Refill:  3    Orders Placed This Encounter  Procedures   PSA    Patient Instructions  Check  with insurance on cost of brain MRI with and without contrast - CPT code is [phone]. You are doing well today Continue current medicines.  Trial of lisinopril  2.5mg  tablets - monitor blood pressures occasionally and restart if running consistently >135/85.  Return in  6 months for testosterone  / DM follow up, may do virtual visit but I do want labs prior.   Follow up plan: Return in about 6 months (around 11/06/2024) for follow up visit.  Anton Blas, MD

## 2024-05-06 NOTE — Assessment & Plan Note (Signed)
 Doing well on testosterone  replacement 3 pumps daily - continue this.  Discussed brain imaging with MRI with/without contrast to complete workup of secondary hypogonadism with markedly low testosterone  levels prior to treatment , as well as low FSH/LH levels. He will price out MRI - CPT code provided.

## 2024-05-06 NOTE — Assessment & Plan Note (Signed)
 Chronic, bp great control with weight loss.  May try off lisinopril  2.5mg , but restart if needed (bp trends up).

## 2024-05-06 NOTE — Assessment & Plan Note (Signed)
 Chronic, great control on allopurinol  200mg  daily - continue.

## 2024-05-07 ENCOUNTER — Ambulatory Visit: Payer: Self-pay | Admitting: Family Medicine

## 2024-05-08 ENCOUNTER — Other Ambulatory Visit: Payer: Self-pay | Admitting: Family Medicine

## 2024-06-24 ENCOUNTER — Other Ambulatory Visit: Payer: Self-pay | Admitting: Family Medicine

## 2024-06-27 ENCOUNTER — Other Ambulatory Visit: Payer: Self-pay | Admitting: Family Medicine

## 2024-07-23 ENCOUNTER — Other Ambulatory Visit: Payer: Self-pay | Admitting: Family Medicine

## 2024-07-24 NOTE — Telephone Encounter (Signed)
 Patient has follow up 2/27

## 2024-07-25 ENCOUNTER — Other Ambulatory Visit: Payer: Self-pay | Admitting: Family Medicine

## 2024-07-27 NOTE — Telephone Encounter (Signed)
 ERx

## 2024-07-28 ENCOUNTER — Other Ambulatory Visit: Payer: Self-pay | Admitting: Family Medicine

## 2024-07-28 NOTE — Telephone Encounter (Signed)
 Copied from CRM #[id number]. Topic: Clinical - Medication Refill >> Jul 28, 2024  9:36 AM Brittany M wrote: Medication: Semaglutide , 1 MG/DOSE, (OZEMPIC , 1 MG/DOSE,) 4 MG/3ML SOPN  Has the patient contacted their pharmacy? Yes (Agent: If no, request that the patient contact the pharmacy for the refill. If patient does not wish to contact the pharmacy document the reason why and proceed with request.) (Agent: If yes, when and what did the pharmacy advise?)  This is the patient's preferred pharmacy:  [id number], [place] - [address] AT [facility] OF [address] [place] [address] Phone: [phone] Fax: [phone]  Is this the correct pharmacy for this prescription? Yes If no, delete pharmacy and type the correct one.   Has the prescription been filled recently? Yes  Is the patient out of the medication? Yes  Has the patient been seen for an appointment in the last year OR does the patient have an upcoming appointment? Yes  Can we respond through MyChart? Yes  Agent: Please be advised that Rx refills may take up to 3 business days. We ask that you follow-up with your pharmacy.

## 2024-11-06 ENCOUNTER — Other Ambulatory Visit
